# Patient Record
Sex: Female | Born: 1940 | Race: White | Hispanic: No | Marital: Married | Smoking: Current every day smoker
Health system: Southern US, Community
[De-identification: ages and names within clinical notes are randomized; demographics above are authoritative.]

## PROBLEM LIST (undated history)

## (undated) DIAGNOSIS — I10 Essential (primary) hypertension: Secondary | ICD-10-CM

---

## 2000-09-16 ENCOUNTER — Other Ambulatory Visit: Admission: RE | Admit: 2000-09-16 | Discharge: 2000-09-16 | Payer: Self-pay | Admitting: Obstetrics and Gynecology

## 2003-09-29 ENCOUNTER — Ambulatory Visit (HOSPITAL_COMMUNITY): Admission: RE | Admit: 2003-09-29 | Discharge: 2003-09-29 | Payer: Self-pay | Admitting: Obstetrics and Gynecology

## 2004-10-16 ENCOUNTER — Ambulatory Visit (HOSPITAL_COMMUNITY): Admission: RE | Admit: 2004-10-16 | Discharge: 2004-10-16 | Payer: Self-pay | Admitting: Obstetrics and Gynecology

## 2004-11-21 ENCOUNTER — Ambulatory Visit (HOSPITAL_COMMUNITY): Admission: RE | Admit: 2004-11-21 | Discharge: 2004-11-21 | Payer: Self-pay | Admitting: Family Medicine

## 2006-09-13 ENCOUNTER — Ambulatory Visit (HOSPITAL_COMMUNITY): Admission: RE | Admit: 2006-09-13 | Discharge: 2006-09-13 | Payer: Self-pay | Admitting: Obstetrics and Gynecology

## 2006-10-21 ENCOUNTER — Other Ambulatory Visit: Admission: RE | Admit: 2006-10-21 | Discharge: 2006-10-21 | Payer: Self-pay | Admitting: Obstetrics and Gynecology

## 2006-10-27 IMAGING — CR DG KNEE COMPLETE 4+V*R*
4 series · 4 of 4 positions shown · non-contrast
Comparison: None.

CLINICAL DATA: Pain and swelling of the right knee ? No known injury. 
 RIGHT KNEE COMPLETE ? 4 VIEWS:

[view not recorded (1 of 4)]
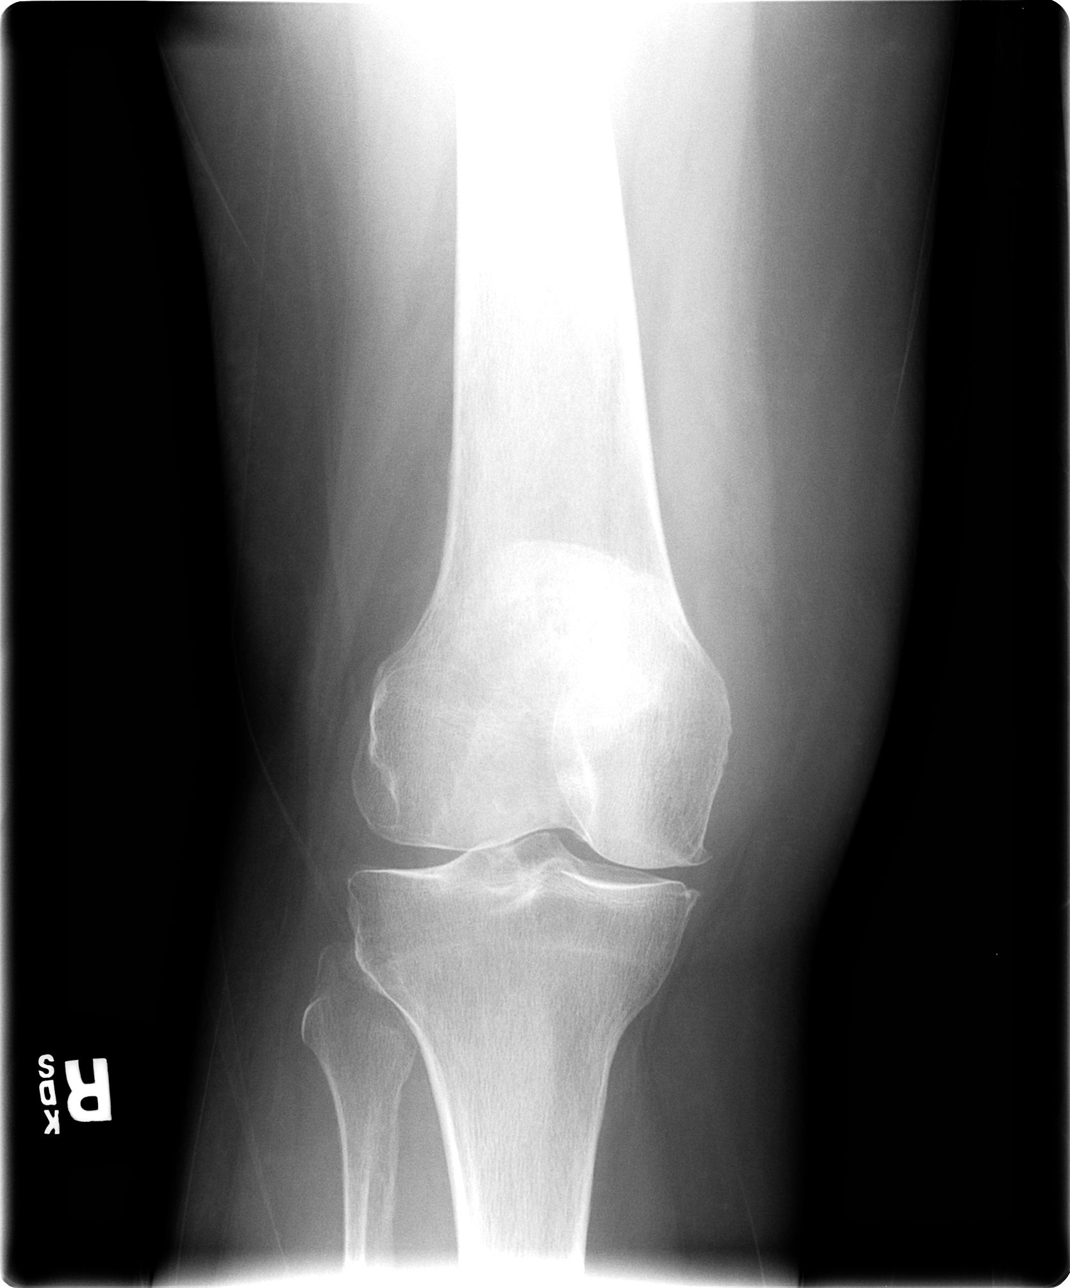

[view not recorded (2 of 4)]
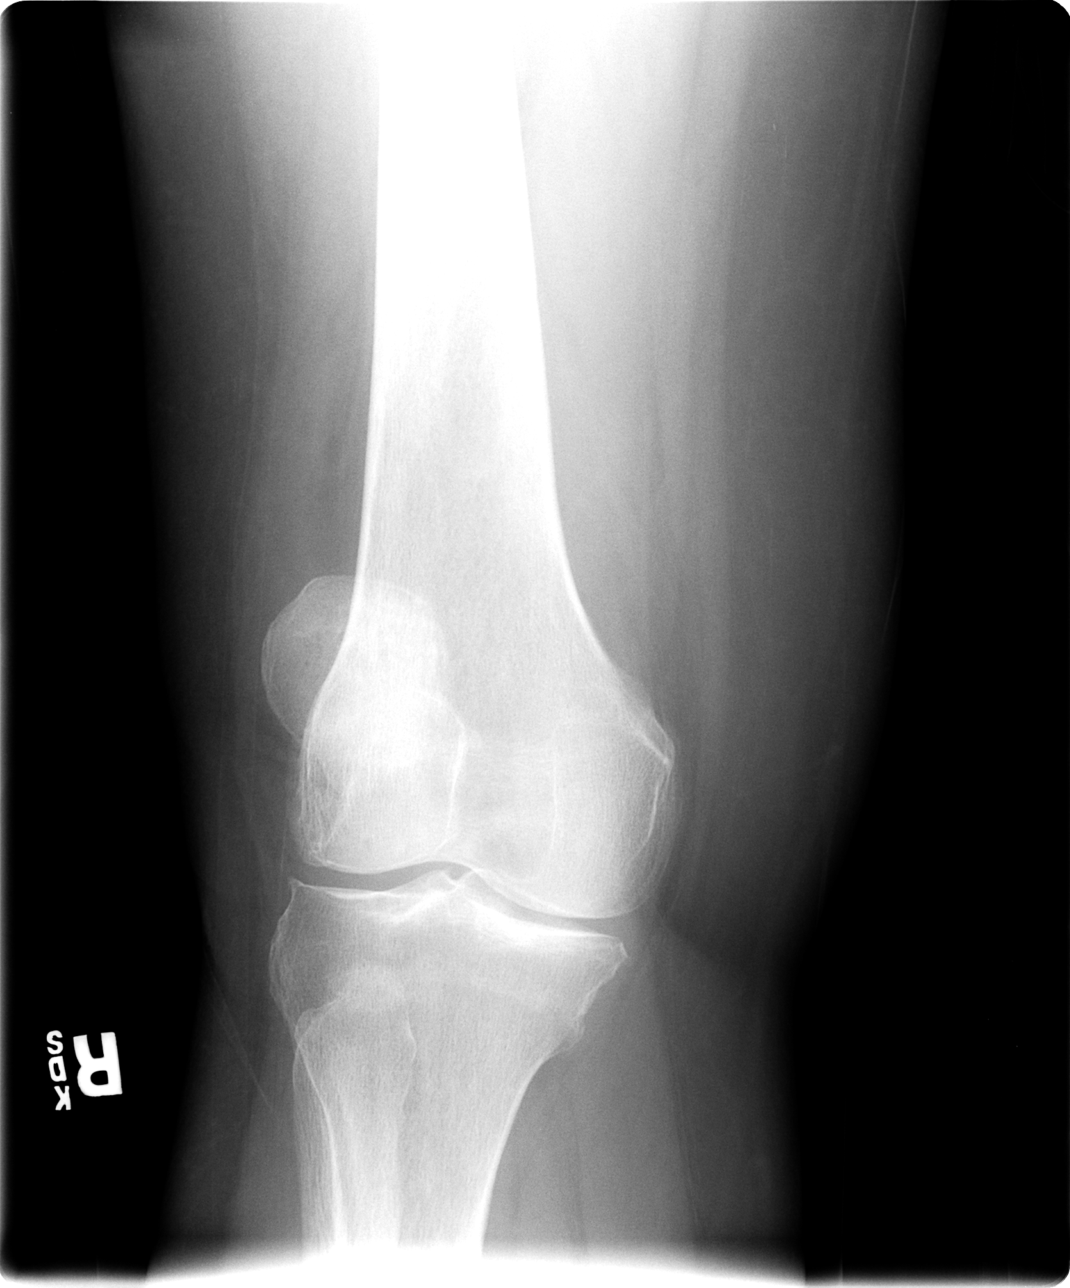

[view not recorded (3 of 4)]
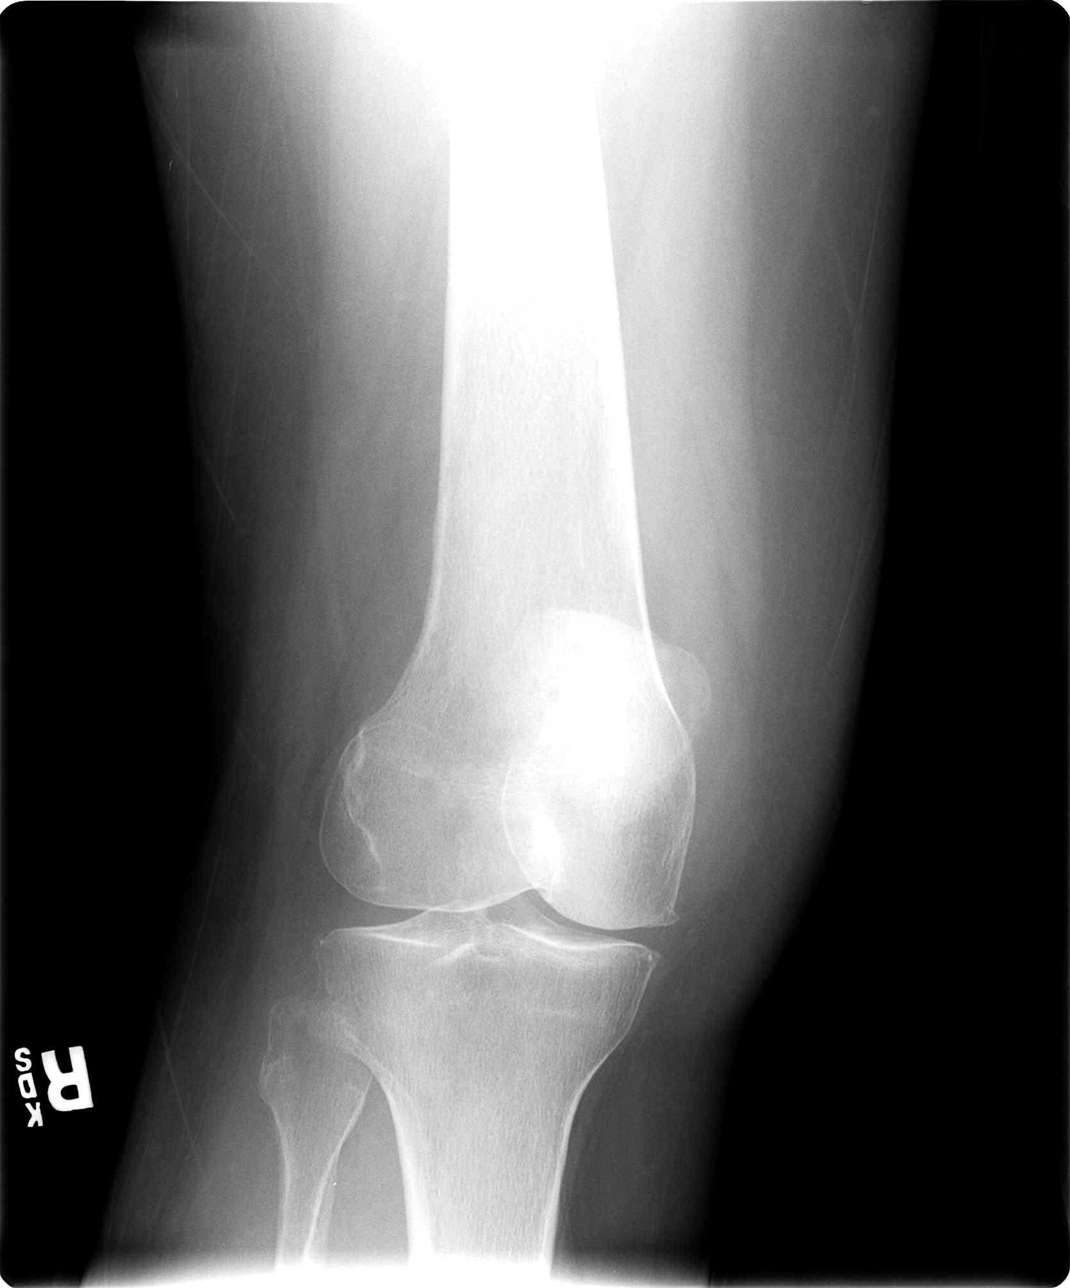

[view not recorded (4 of 4)]
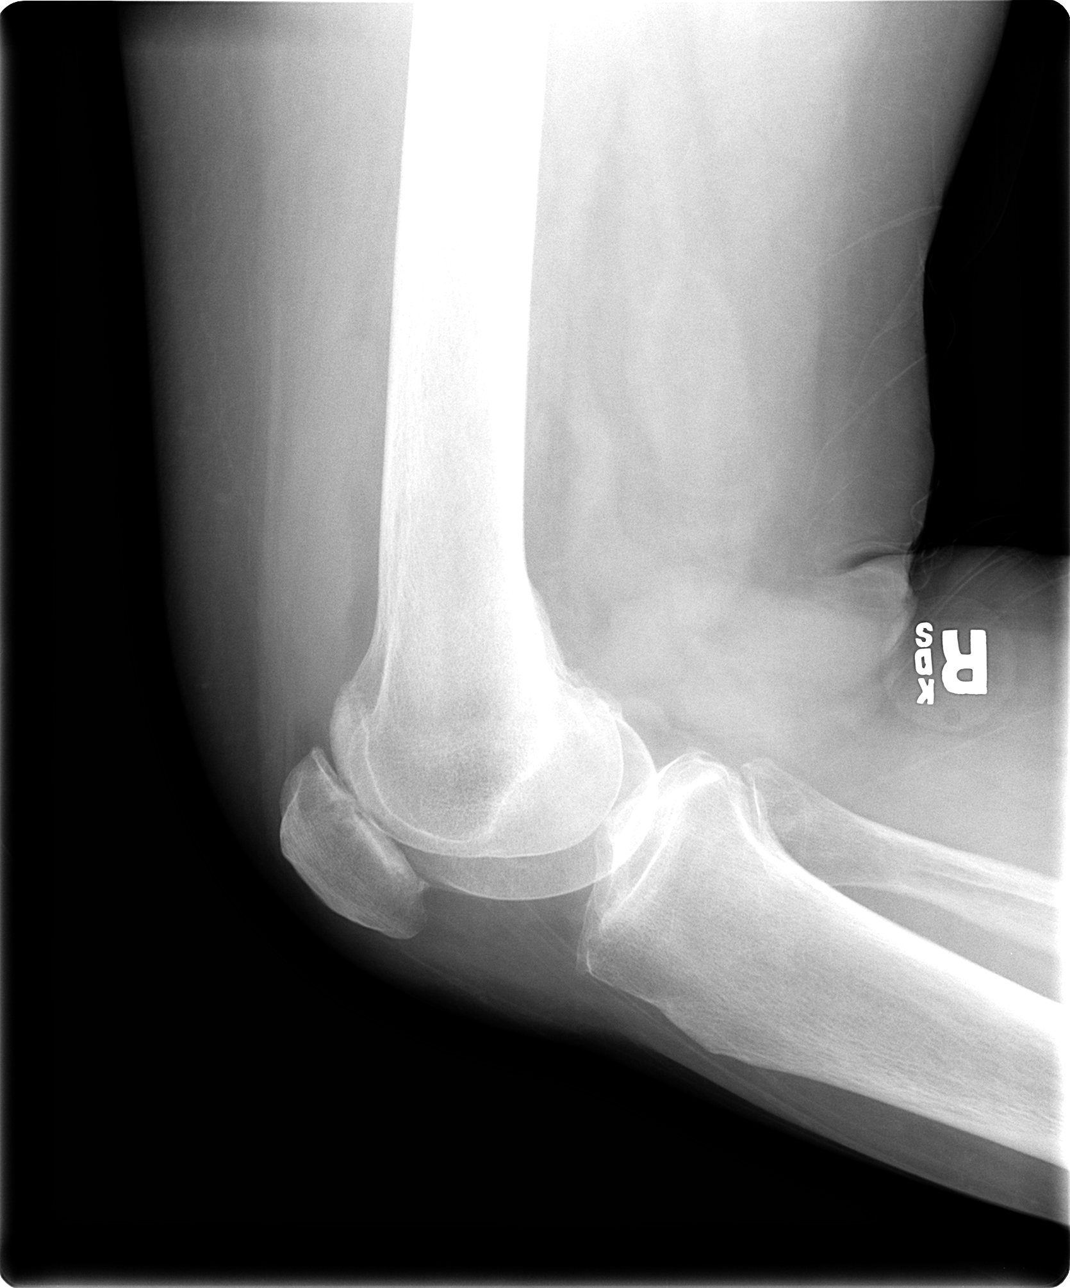

[4 of 4 positions shown; findings below may reference images not displayed]

FINDINGS: Mild compartmental degenerative changes with no acute abnormality.  No joint effusion.
IMPRESSION: Mild degenerative changes.

## 2006-10-30 ENCOUNTER — Ambulatory Visit: Payer: Self-pay | Admitting: Gastroenterology

## 2006-11-26 ENCOUNTER — Encounter: Payer: Self-pay | Admitting: Gastroenterology

## 2006-11-26 ENCOUNTER — Ambulatory Visit (HOSPITAL_COMMUNITY): Admission: RE | Admit: 2006-11-26 | Discharge: 2006-11-26 | Payer: Self-pay | Admitting: Gastroenterology

## 2006-11-26 ENCOUNTER — Ambulatory Visit: Payer: Self-pay | Admitting: Gastroenterology

## 2006-12-19 ENCOUNTER — Ambulatory Visit: Payer: Self-pay | Admitting: Internal Medicine

## 2007-10-23 DIAGNOSIS — E785 Hyperlipidemia, unspecified: Secondary | ICD-10-CM | POA: Insufficient documentation

## 2007-10-23 DIAGNOSIS — M1710 Unilateral primary osteoarthritis, unspecified knee: Secondary | ICD-10-CM

## 2007-10-23 DIAGNOSIS — D126 Benign neoplasm of colon, unspecified: Secondary | ICD-10-CM

## 2007-10-23 DIAGNOSIS — E7800 Pure hypercholesterolemia, unspecified: Secondary | ICD-10-CM

## 2007-10-23 DIAGNOSIS — K219 Gastro-esophageal reflux disease without esophagitis: Secondary | ICD-10-CM | POA: Insufficient documentation

## 2007-10-23 DIAGNOSIS — N390 Urinary tract infection, site not specified: Secondary | ICD-10-CM | POA: Insufficient documentation

## 2007-10-23 DIAGNOSIS — R3129 Other microscopic hematuria: Secondary | ICD-10-CM | POA: Insufficient documentation

## 2007-10-23 DIAGNOSIS — K449 Diaphragmatic hernia without obstruction or gangrene: Secondary | ICD-10-CM | POA: Insufficient documentation

## 2007-11-06 ENCOUNTER — Ambulatory Visit (HOSPITAL_COMMUNITY): Admission: RE | Admit: 2007-11-06 | Discharge: 2007-11-06 | Payer: Self-pay | Admitting: Obstetrics and Gynecology

## 2007-12-29 ENCOUNTER — Other Ambulatory Visit: Admission: RE | Admit: 2007-12-29 | Discharge: 2007-12-29 | Payer: Self-pay | Admitting: Obstetrics & Gynecology

## 2008-02-18 ENCOUNTER — Ambulatory Visit (HOSPITAL_COMMUNITY): Admission: RE | Admit: 2008-02-18 | Discharge: 2008-02-18 | Payer: Self-pay | Admitting: Family Medicine

## 2008-03-23 ENCOUNTER — Ambulatory Visit: Payer: Self-pay | Admitting: Vascular Surgery

## 2010-02-26 ENCOUNTER — Encounter: Payer: Self-pay | Admitting: Obstetrics and Gynecology

## 2010-06-20 NOTE — Assessment & Plan Note (Signed)
NAMEMarland Kitchen  Rich, Charlotte                  CHART#:  [id number]   DATE:  12/19/2006                       DOB:  07-01-1940   CHIEF COMPLAINT:  Follow up of procedure.   SUBJECTIVE:  Charlotte Rich is here for a follow up. She was last seen at the time  of EGD and colonoscopy by Dr. Kassie Mends. She was found to have an  esophageal stricture and underwent Savary dilation. She has complete  resolution of her dysphagia-type symptoms. She denies any heartburn  symptoms or epigastric pain which she was having before. Her bowel  movements are regular. There is no melena or rectal bleeding. She is  also found to have multiple polyps when in the transverse colon, two in  the rectum. The rectal polyp was tubular adenoma and the transverse  polyp was an adenoma as well. She had pancolonic diverticulosis and a 2  to 3 centimeter hiatal hernia.   CURRENT MEDICATIONS:  See the updated list.   ALLERGIES:  No known drug allergies.   PHYSICAL EXAMINATION:  VITAL SIGNS:  Weight 166 pounds, temperature  98.3, blood pressure 130/80, pulse 88.  GENERAL:  Pleasant, well developed, well nourished Caucasian female in  no acute distress.  SKIN:  Warm and dry. No jaundice.   IMPRESSION:  1. Esophageal stricture status post Savary dilations with resolution      of dysphagia-type symptoms.  2. Intermittent non-erosive reflux disease.  3. Colonic adenomas.   PLAN:  1. Prilosec OTC 20 mg daily as needed, number of 42, with 11 refills.  2. High fiber diet.  3. She will need a colonoscopy in five years and her first degree      relatives should be colon      cancer screened at age 63.  4. Office visit as needed.       Tana Coast, P.A.  Electronically Signed     R. Roetta Sessions, M.D.  Electronically Signed    LL/MEDQ  D:  12/19/2006  T:  12/20/2006  Job:  [id number]   cc:   Tilda Burrow, M.D.

## 2010-06-20 NOTE — Consult Note (Signed)
NEW PATIENT CONSULTATION   Charlotte Rich, Charlotte Rich  DOB:  [birth year]                                       03/23/2008  EAVWU#:[id number]    The patient is a 70 year old female referred for vascular surgery  consultation by Dr. Renard Matter for a pulsatile mass in the lower aspect of  the right neck.  This healthy female at her gyn exam this year and her  gynecologist noted a pulsatile mass in the lower neck on the right.  This was asymptomatic and was referred to Dr. Renard Matter who obtained an  ultrasound at [facility] which revealed that this was a dilated  and tortuous innominate and carotid artery with no other masses noted.  The patient denies any history of stroke or hemispheric or  nonhemispheric TIAs, amaurosis fugax, diplopia, blurred vision or  syncope.  She denies any pain in the neck or any trauma to the neck.   PAST MEDICAL HISTORY:  1. Hyperlipidemia.  2. Negative for diabetes, hypertension, coronary artery disease, COPD      or stroke.   PAST SURGICAL HISTORY:  1. Cholecystectomy.  2. Esophageal dilatation.   FAMILY HISTORY:  Positive for stroke in an uncle.  Negative for coronary  artery disease and diabetes.   SOCIAL HISTORY:  The patient is married, has two children and is a  retired Runner, broadcasting/film/video.  Smokes approximately half pack of cigarettes per day  for 45-50 years and does not use alcohol.   REVIEW OF SYSTEMS:  Does have symptoms of reflux esophagitis, hiatal  hernia and diffuse arthritis.  Negative for chest pain, dyspnea on  exertion, PND, orthopnea or pulmonary symptoms.   ALLERGIES:  None known.   MEDICATIONS:  1. Calcium with vitamin D.  2. Vitamin D.  3. Simvastatin 20 mg daily.   PHYSICAL EXAMINATION:  Vital signs:  Blood pressure 142/78, heart rate  72, respiration 14.  General:  She is a healthy-appearing female in no  apparent distress, alert and oriented x3.  Neck:  Supple, 3+ carotid  pulses are palpable.  There is a prominent  pulsation in the right  supraclavicular area.  It is lateral to the sternal notch.  This  measures about 2 to 2.5 cm in diameter.  It is not expansile in nature.  It is nontender.  Appears to be a redundant vessel.  She has excellent  carotid pulses bilaterally with no other masses palpable.  No bruits  audible.  Chest:  Clear to auscultation.  Cardiovascular:  Regular  rhythm.  No murmurs.  Abdomen:  Soft, nontender with no masses.  She has  3+ femoral, popliteal and dorsalis pedis pulses bilaterally.  Both feet  are well-perfused.   Carotid duplex exam reveals the common carotid artery to have no flow  reduction and no aneurysm is noted and the vessel is quite tortuous.  Of  note is the fact that a nonvascularized small anechoic mass was noted  bilaterally above the bifurcation of the internal carotid and external  carotid vessels which is totally separate from the area of concern.  (I  was unable to palpate any abnormalities on physical exam in this area  and this may well represent lymph nodes).   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  No  further evaluation or workup is needed.  Will see again on a p.r.n.  basis.   Quita Skye Hart [place], M.D.  Electronically Signed   JDL/MEDQ  D:  03/23/2008  T:  03/24/2008  Job:  [id number]   cc:   Angus G. Renard Matter, MD

## 2010-06-20 NOTE — Op Note (Signed)
NAMEZOOEY, SCHREURS NO.:  [id number]   MEDICAL RECORD NO.:  [id number]          PATIENT TYPE:  AMB   LOCATION:  DAY                           FACILITY:  [facility]   PHYSICIAN:  Kassie Mends, M.D.      DATE OF BIRTH:  09/02/1940   DATE OF PROCEDURE:  11/26/2006  DATE OF DISCHARGE:                               OPERATIVE REPORT   REFERRING Kelle Ruppert:  Cyril Mourning with Dr. Christin Bach.   PROCEDURE:  1. Colonoscopy with cold forceps and snare cautery polypectomy.  2. Esophagogastroduodenoscopy with cold-forceps biopsy and Savary      dilation to 15 mm.   INDICATION FOR EXAMINATION:  Ms. Haught is a 70 year old female who  presents with epigastric pain.  She describes it as a burning that has  been present for approximately four years.  She did take Prilosec twice  a day for 14 days and felt better, but the symptoms still persist.  She  has also had some difficulty swallowing, associated with pain in her  chest.  She chronically uses Aleve and Advil for her arthritis.   FINDINGS:  1. An 8-mm flat transverse colon polyp all on a fold which was removed      via cold forceps and snare cautery.  2. A 6-mm flat rectal polyp removed via cold forceps.  A 1-cm sessile      rectal polyp removed via snare cautery.  Otherwise, no masses,      inflammatory changes or arteriovenous malformations seen.  3. Pancolonic diverticulosis, most pronounced in the left colon.  4. Small internal hemorrhoids.  5. Distal esophageal stricture which allowed the scope to pass without      resistance.  The esophageal lumen was narrowed to approximately 12      mm.  The esophagus was dilated successively using the Savary      dilators from 12.8 mm to 15 mm.  The last dilator passed with      moderate resistance.  6. A 2- to 3-cm hiatal hernia.  Streaky erythema seen in the antrum      without erosion or ulceration.  Biopsies obtained via cold forceps      to evaluate for H pylori gastritis.   Otherwise normal duodenum.   RECOMMENDATIONS:  1. Clear liquid diet for 24 hours, then low residue diet for 3 hours.      Then, she may begin a high-fiber diet.  She was given a handout on      clear liquid diet, low-residue diet and high-fiber diet.  2. No aspirin, NSAIDs or anticoagulation for 7 days.  3. Will call Ms. Widmer for the results of her biopsies.  If her      biopsies are positive for H pylori, she will receive treatment.  If      her biopsies from her polyps from her colon are adenomatous, then      she should have a screening colonoscopy in 5 years.  First degree      relatives should then begin colon cancer screening at age 22 and  then every 10 years because her polyps were diagnosed after the age      of 47.  4. She is also given information on diverticulosis, polyps and      hemorrhoids.  5. OPV with Tana Coast in one month to reassess swallowing.   MEDICATION:  1. Demerol 50 mg IV.  2. Versed 6 mg IV.   PROCEDURE TECHNIQUE:  Physical exam was performed.  Informed consent was  obtained from the patient explaining benefits, risks and alternatives to  the procedure.  The patient was connected to the monitor and placed in  left lateral position.  Continuous oxygen was provided by nasal cannula  and IV medicine administered through an indwelling cannula.  After  administration of sedation and rectal exam, the patient's rectum was  intubated, and the scope was advanced under direct visualization to the  cecum.  The scope was removed slowly by carefully examining the color,  texture, anatomy and integrity of the mucosa on the way out.   After the colonoscopy, the patient's esophagus was intubated with a  diagnostic gastroscope, and the scope was advanced under direct  visualization to the second portion of the duodenum.  The scope was  removed slowly by carefully examining the color, texture, anatomy and  integrity of the mucosa on the way out.  Prior to  withdrawing the scope,  retroflexed view of the cardia and the fundus portion was performed  which demonstrated the hiatal hernia and the distal esophageal  stricture.  The Savary guide wire was introduced through the diagnostic  gastroscope.  The scope was withdrawn, and the Savary dilators were  introduced successively over the guide wire.  The patient was recovered  in endoscopy and discharged home in satisfactory condition.      Kassie Mends, M.D.  Electronically Signed     SM/MEDQ  D:  11/26/2006  T:  11/26/2006  Job:  [id number]   cc:   Toma Deiters, M.D.  Fax: [phone]

## 2010-06-20 NOTE — Consult Note (Signed)
NAMECHRISHELLE, ZITO NO.:  [id number]   MEDICAL RECORD NO.:  [id number]          PATIENT TYPE:  AMB   LOCATION:  END                           FACILITY:  [facility]   PHYSICIAN:  Kassie Mends, M.D.      DATE OF BIRTH:  01-17-1941   DATE OF CONSULTATION:  10/30/2006  DATE OF DISCHARGE:                                 CONSULTATION   REQUESTING PHYSICIAN:  Cyril Mourning, NP with Dr. Christin Bach.   REASON FOR CONSULTATION:  Pain with swallowing, colonoscopy.   HISTORY OF PRESENT ILLNESS:  The patient is a 70 year old female with  presents for further evaluation of pain with swallowing.  She has had  this going on for several weeks now.  She states whenever she eats  certain foods, whether they are grilled or fried, she has pain in her  chest when she swallows.  It feels as though that she has slot as it  moves down into her stomach.  She also notes this with eating cinnamon.  She does have occasional reflux.  She recently started Prilosec daily  only for a few days.  She notes at times she feels like the food does  not go down well, but she often will get up a lot of phlegm when this  occurs.  She thinks it is from her sinuses draining.  She had a recent  episode which caused her to vomit.  She complains of epigastric burning  with certain foods including fried or grills foods.  Her bowel movements  occur regularly.  She generally has postprandial bowel movements without  any urgency, however.  She denies any melena or rectal bleeding.  She  states she took Aleve and Advil previously for her arthritis, but really  none in the last couple of years; she had to stop it on occasion because  of epigastric pain.  She never had an EGD with colonoscopy.   CURRENT MEDICATIONS:  1. Vitamin D two each week.  2. Calcium with vitamin D daily.  3. Prilosec daily.  4. Cipro b.i.d. for UTI.  5. Zocor 20 mg daily.  6. Advil p.r.n., but rarely takes.   ALLERGIES:  No known drug  allergies.   PAST MEDICAL HISTORY:  1. GERD.  2. Hypercholesterolemia.  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  4. Currently being treated for a UTI.  5. Cholecystectomy 7 years ago for stones.   FAMILY HISTORY:  Mother is 27; she has hypertension and  hypercholesterolemia.  Father is deceased at age 73.  No known chronic  medical problems.  No family history of colorectal cancer.   SOCIAL HISTORY:  She is married with 2 children.  She is retired from  the school system from the past 3 years. She has smoked a half a pack of  cigarettes daily.  She admits to having 1 glass of white wine  approximately 3 times a week.   REVIEW OF SYSTEMS:  GI:  See HPI.  CONSTITUTIONAL:  Denies any weight  loss.  CARDIOPULMONARY:  Denies shortness of breath.  She has pain with  swallowing and she had pain in her chest when she swallows.  GU:  Denied  any dysuria or gross hematuria,but recently had microscopic hematuria on  UA and is being treated for a UTI.   PHYSICAL EXAMINATION:  VITAL SIGNS:  Weight 171, height 5 feet 3-1/2  inches.  Temperature 97.8, blood pressure 144/96, pulse 80.  GENERAL:  A well-nourished, well-developed Caucasian female in no acute  distress.  SKIN:  Warm and dry.  No jaundice.  HEENT:  Sclerae anicteric.  Oropharyngeal mucosa moist and pink. No  lesions, erythema or exudate.  NECK:  No lymphadenopathy or thyromegaly.  CHEST:  Relatively clear to auscultation.  CARDIAC:  Exam reveals a regular rate and rhythm, normal S1 and S2, no  murmurs, rubs, or gallops.  ABDOMEN:  Positive bowel sounds.  Abdomen is soft, nontender and non-  distended.  No organomegaly or masses.  No rebound tenderness.  No  guarding.  No palpable masses.  EXTREMITIES:  No edema.   IMPRESSION/PLAN:  The patient is a pleasant 70 year old lady with  several-week history of epigastric burning and burning in her chest when  she swallows certain foods.  She had some vague dysphagia-type symptoms.  Indeed,  there is a  __________  possibility of esophagitis or gastritis.  Less likely is peptic ulcer disease.  She is taking limited nonsteroidal  anti-inflammatory drugs.  She has never had a colonoscopy and  recommended screening colonoscopy at this time,  esophagogastroduodenoscopy and colonoscopy by Dr. Kassie Mends in the  near future.  I discussed risks and alternatives of significance with  regards to, but not limited to, the __________  medication, bleeding,  infection and perforation.  The patient is agreeable to proceed.   I would like to thank Cyril Mourning, nurse practitioner with Dr. Christin Bach, for allowing [place] to take part in the care of this patient.      Tana Coast, P.A.      Kassie Mends, M.D.  Electronically Signed    LL/MEDQ  D:  10/30/2006  T:  10/31/2006  Job:  [id number]   cc:   Tilda Burrow, M.D.  Fax: [phone]

## 2010-06-20 NOTE — Procedures (Signed)
CAROTID DUPLEX EXAM   INDICATION:  Carotid artery disease.   HISTORY:  Diabetes:  No.  Cardiac:  No.  Hypertension:  No.  Smoking:  Yes.  Previous Surgery:  No.  CV History:  No.  Amaurosis Fugax No, Paresthesias No, Hemiparesis No.                                       RIGHT             LEFT  Brachial systolic pressure:         154               146  Brachial Doppler waveforms:         Within normal limits                Within normal limits  Vertebral direction of flow:        Antegrade         Antegrade  DUPLEX VELOCITIES (cm/sec)  CCA peak systolic                   105               94  ECA peak systolic                   149               103  ICA peak systolic                   89                109  ICA end diastolic                   31                46  PLAQUE MORPHOLOGY:                  None              None  PLAQUE AMOUNT:                      None              None  PLAQUE LOCATION:                    None              None   IMPRESSION:  1. Patent bilateral internal carotid arteries with no evidence of      stenosis.  2. 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   ___________________________________________  Quita Skye. Hart [place], M.D.   AC/MEDQ  D:  03/23/2008  T:  03/23/2008  Job:  [id number]

## 2011-10-25 ENCOUNTER — Encounter: Payer: Self-pay | Admitting: *Deleted

## 2012-12-15 ENCOUNTER — Other Ambulatory Visit (HOSPITAL_COMMUNITY): Payer: Self-pay | Admitting: Family Medicine

## 2012-12-15 DIAGNOSIS — Z139 Encounter for screening, unspecified: Secondary | ICD-10-CM

## 2012-12-18 ENCOUNTER — Ambulatory Visit (HOSPITAL_COMMUNITY)
Admission: RE | Admit: 2012-12-18 | Discharge: 2012-12-18 | Disposition: A | Discharge status: Home / Self Care | Payer: Medicare Other | Source: Ambulatory Visit | Attending: Family Medicine | Admitting: Family Medicine

## 2012-12-18 ENCOUNTER — Other Ambulatory Visit (HOSPITAL_COMMUNITY): Payer: Self-pay | Admitting: Family Medicine

## 2012-12-18 DIAGNOSIS — F17200 Nicotine dependence, unspecified, uncomplicated: Secondary | ICD-10-CM | POA: Insufficient documentation

## 2012-12-18 DIAGNOSIS — Z1231 Encounter for screening mammogram for malignant neoplasm of breast: Secondary | ICD-10-CM | POA: Insufficient documentation

## 2012-12-18 DIAGNOSIS — Z139 Encounter for screening, unspecified: Secondary | ICD-10-CM

## 2012-12-23 ENCOUNTER — Other Ambulatory Visit: Payer: Self-pay | Admitting: Family Medicine

## 2012-12-23 DIAGNOSIS — R928 Other abnormal and inconclusive findings on diagnostic imaging of breast: Secondary | ICD-10-CM

## 2013-02-04 ENCOUNTER — Other Ambulatory Visit: Payer: Self-pay | Admitting: Family Medicine

## 2013-02-04 ENCOUNTER — Ambulatory Visit
Admission: RE | Admit: 2013-02-04 | Discharge: 2013-02-04 | Disposition: A | Discharge status: Home / Self Care | Payer: Medicare Other | Source: Ambulatory Visit | Attending: Family Medicine | Admitting: Family Medicine

## 2013-02-04 DIAGNOSIS — R928 Other abnormal and inconclusive findings on diagnostic imaging of breast: Secondary | ICD-10-CM

## 2014-07-06 ENCOUNTER — Other Ambulatory Visit (HOSPITAL_COMMUNITY): Payer: Self-pay | Admitting: Family Medicine

## 2014-07-06 DIAGNOSIS — Z1231 Encounter for screening mammogram for malignant neoplasm of breast: Secondary | ICD-10-CM

## 2014-07-15 ENCOUNTER — Ambulatory Visit (HOSPITAL_COMMUNITY)
Admission: RE | Admit: 2014-07-15 | Discharge: 2014-07-15 | Disposition: A | Discharge status: Home / Self Care | Payer: Medicare Other | Source: Ambulatory Visit | Attending: Family Medicine | Admitting: Family Medicine

## 2014-07-15 DIAGNOSIS — Z1231 Encounter for screening mammogram for malignant neoplasm of breast: Secondary | ICD-10-CM | POA: Diagnosis present

## 2014-11-23 IMAGING — CR DG CHEST 2V
2 series · 2 of 2 positions shown · non-contrast
Comparison: None.

CLINICAL DATA: History of tobacco use for multiple years, initial
encounter

EXAM:
CHEST  2 VIEW

[view not recorded (1 of 2)]
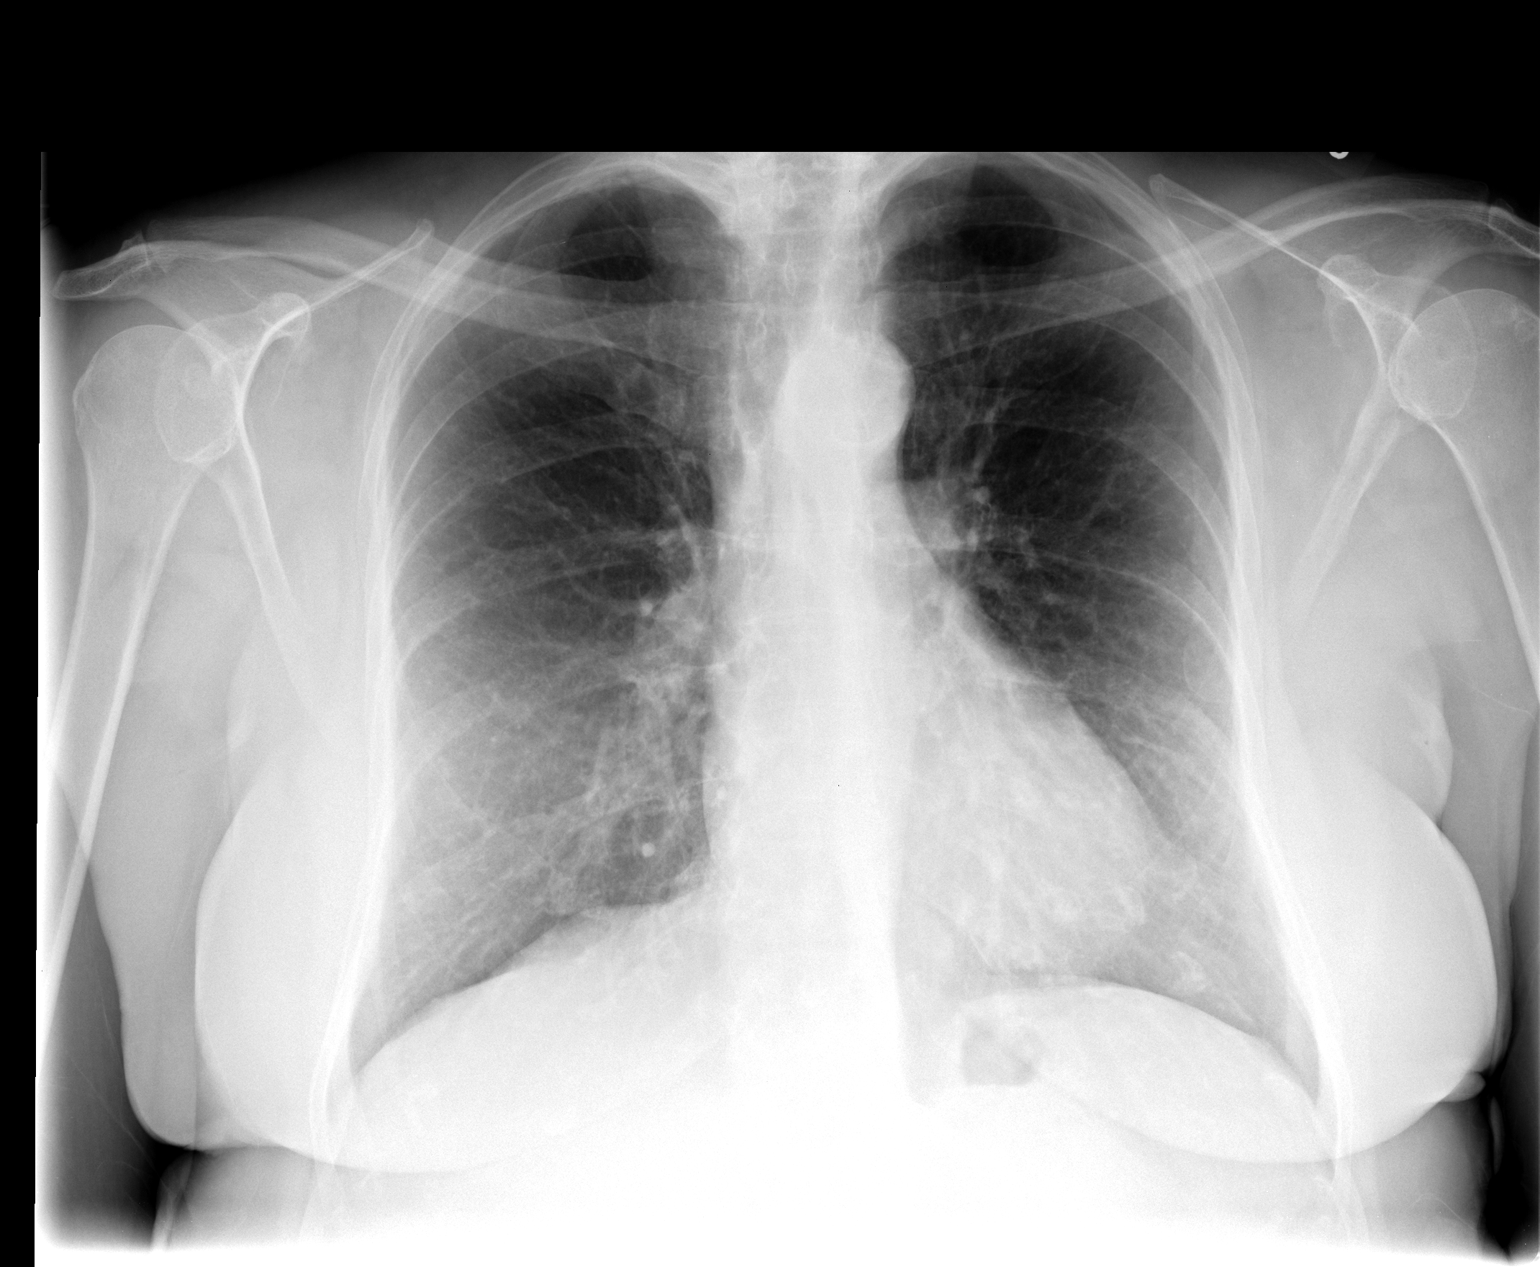

[view not recorded (2 of 2)]
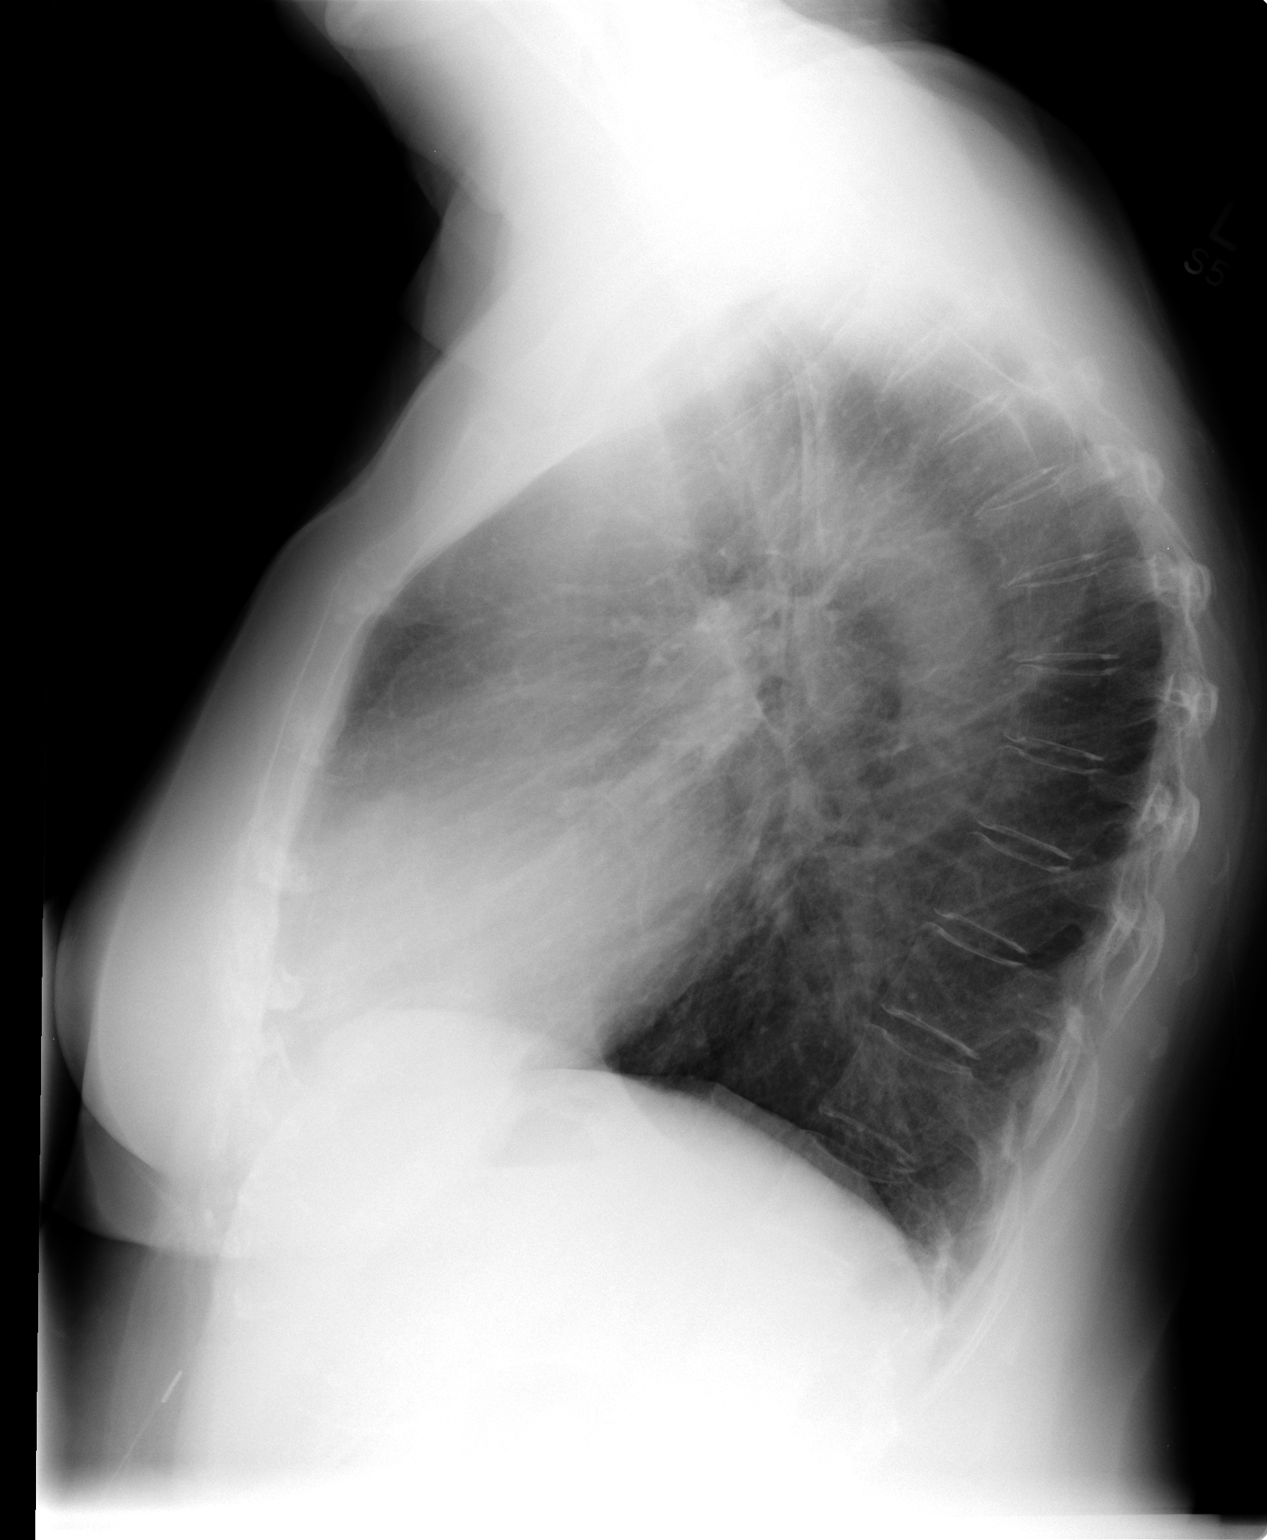

[2 of 2 positions shown; findings below may reference images not displayed]

FINDINGS: The cardiac shadow is within normal limits. The lungs are well
aerated bilaterally without focal infiltrate or sizable effusion.
The bony structures are unremarkable.
IMPRESSION: No acute abnormality is noted.

## 2022-08-14 ENCOUNTER — Other Ambulatory Visit: Payer: Self-pay

## 2022-08-14 ENCOUNTER — Ambulatory Visit: Payer: Medicare PPO | Admitting: Family Medicine

## 2022-08-14 ENCOUNTER — Encounter: Payer: Self-pay | Admitting: Family Medicine

## 2022-08-14 ENCOUNTER — Encounter (HOSPITAL_COMMUNITY): Payer: Self-pay | Admitting: Emergency Medicine

## 2022-08-14 ENCOUNTER — Emergency Department (HOSPITAL_COMMUNITY)
Admission: EM | Admit: 2022-08-14 | Discharge: 2022-08-14 | Disposition: A | Discharge status: Home / Self Care | Payer: Medicare PPO | Attending: Emergency Medicine | Admitting: Emergency Medicine

## 2022-08-14 ENCOUNTER — Emergency Department (HOSPITAL_COMMUNITY): Payer: Medicare PPO

## 2022-08-14 VITALS — BP 200/100 | HR 76 | Ht 62.0 in | Wt 147.1 lb

## 2022-08-14 DIAGNOSIS — R7301 Impaired fasting glucose: Secondary | ICD-10-CM | POA: Diagnosis not present

## 2022-08-14 DIAGNOSIS — E7849 Other hyperlipidemia: Secondary | ICD-10-CM

## 2022-08-14 DIAGNOSIS — Z79899 Other long term (current) drug therapy: Secondary | ICD-10-CM | POA: Insufficient documentation

## 2022-08-14 DIAGNOSIS — E038 Other specified hypothyroidism: Secondary | ICD-10-CM

## 2022-08-14 DIAGNOSIS — E559 Vitamin D deficiency, unspecified: Secondary | ICD-10-CM

## 2022-08-14 DIAGNOSIS — R519 Headache, unspecified: Secondary | ICD-10-CM | POA: Diagnosis present

## 2022-08-14 DIAGNOSIS — Z1159 Encounter for screening for other viral diseases: Secondary | ICD-10-CM

## 2022-08-14 DIAGNOSIS — Z13820 Encounter for screening for osteoporosis: Secondary | ICD-10-CM

## 2022-08-14 DIAGNOSIS — I10 Essential (primary) hypertension: Secondary | ICD-10-CM | POA: Insufficient documentation

## 2022-08-14 DIAGNOSIS — I169 Hypertensive crisis, unspecified: Secondary | ICD-10-CM | POA: Insufficient documentation

## 2022-08-14 DIAGNOSIS — Z114 Encounter for screening for human immunodeficiency virus [HIV]: Secondary | ICD-10-CM

## 2022-08-14 HISTORY — DX: Essential (primary) hypertension: I10

## 2022-08-14 LAB — BASIC METABOLIC PANEL
Anion gap: 10 (ref 5–15)
BUN: 12 mg/dL (ref 8–23)
CO2: 23 mmol/L (ref 22–32)
Calcium: 9.3 mg/dL (ref 8.9–10.3)
Chloride: 103 mmol/L (ref 98–111)
Creatinine, Ser: 0.7 mg/dL (ref 0.44–1.00)
GFR, Estimated: >60 mL/min (ref >60)
Glucose, Bld: 101 mg/dL — ABNORMAL HIGH (ref 70–99)
Potassium: 3.7 mmol/L (ref 3.5–5.1)
Sodium: 136 mmol/L (ref 135–145)

## 2022-08-14 LAB — CBC
HCT: 43.8 % (ref 36.0–46.0)
Hemoglobin: 14.2 g/dL (ref 12.0–15.0)
MCH: 30 pg (ref 26.0–34.0)
MCHC: 32.4 g/dL (ref 30.0–36.0)
MCV: 92.4 fL (ref 80.0–100.0)
Platelets: 397 10*3/uL (ref 150–400)
RBC: 4.74 MIL/uL (ref 3.87–5.11)
RDW: 14.2 % (ref 11.5–15.5)
WBC: 9.4 10*3/uL (ref 4.0–10.5)
nRBC: 0 % (ref 0.0–0.2)

## 2022-08-14 MED ORDER — LISINOPRIL 10 MG PO TABS: 10.0000 mg | ORAL_TABLET | Freq: Every day | ORAL | 1 refills | Status: DC

## 2022-08-14 MED ORDER — LISINOPRIL 10 MG PO TABS
10.0000 mg | ORAL_TABLET | ORAL | Status: AC
  Administered 2022-08-14: 10 mg via ORAL
  Filled 2022-08-14: qty 1

## 2022-08-14 NOTE — Assessment & Plan Note (Addendum)
Hx of Hypertension Unsure of the names of antihypertensives that she was on BP in the clinic is 200/100 C/o a slight headache No chest pain, palpitations, SOB or visual disturbances EKG in the clinic shows sinus rthyme with left atrial enlargement Reports BP of 180/100 a week ago Encouraged to go to ED for hypertensive crisis

## 2022-08-14 NOTE — Progress Notes (Signed)
New Patient Office Visit  Subjective:  Patient ID: Charlotte Rich, female    DOB: [birth year]  Age: 82 y.o. MRN: [id number]  CC:  Chief Complaint  Patient presents with   Establish Care    New patient establishing care has not had a pcp in a few years.     HPI Charlotte Rich is a 82 y.o. female with past medical history of HTN presents for establishing care. For the details of today's visit, please refer to the assessment and plan.     Past Medical History:  Diagnosis Date   Hypertension     History reviewed. No pertinent surgical history.  History reviewed. No pertinent family history.  Social History   Socioeconomic History   Marital status: Married    Spouse name: Not on file   Number of children: Not on file   Years of education: Not on file   Highest education level: Not on file  Occupational History   Not on file  Tobacco Use   Smoking status: Every Day    Types: Cigarettes   Smokeless tobacco: Never   Tobacco comments:    8 cig a day  Substance and Sexual Activity   Alcohol use: Yes    Comment: occ   Drug use: Not Currently   Sexual activity: Not Currently  Other Topics Concern   Not on file  Social History Narrative   Not on file   Social Determinants of Health   Financial Resource Strain: Not on file  Food Insecurity: Not on file  Transportation Needs: Not on file  Physical Activity: Not on file  Stress: Not on file  Social Connections: Not on file  Intimate Partner Violence: Not on file    ROS Review of Systems  Constitutional:  Negative for chills and fever.  Eyes:  Negative for visual disturbance.  Respiratory:  Negative for chest tightness and shortness of breath.   Neurological:  Positive for headaches. Negative for dizziness.    Objective:   Today's Vitals: BP (!) 200/100 (BP Location: Left Arm)   Pulse 76   Ht 5\' 2"  (1.575 m)   Wt 147 lb 1.3 oz (66.7 kg)   SpO2 92%   BMI 26.90 kg/m   Physical Exam HENT:     Head:  Normocephalic.     Mouth/Throat:     Mouth: Mucous membranes are moist.  Cardiovascular:     Rate and Rhythm: Normal rate.     Heart sounds: Normal heart sounds.  Pulmonary:     Effort: Pulmonary effort is normal.     Breath sounds: Normal breath sounds.  Neurological:     Mental Status: She is alert.      Assessment & Plan:   Hypertensive crisis Assessment & Plan: Hx of Hypertension Unsure of the names of antihypertensives that she was on BP in the clinic is 200/100 C/o a slight headache No chest pain, palpitations, SOB or visual disturbances EKG in the clinic shows sinus rthyme with left atrial enlargement Reports BP of 180/100 a week ago Encouraged to go to ED for hypertensive crisis    Hypertension, unspecified type -     EKG 12-Lead  IFG (impaired fasting glucose) -     Hemoglobin A1c  Vitamin D deficiency -     VITAMIN D 25 Hydroxy (Vit-D Deficiency, Fractures)  Need for hepatitis C screening test -     Hepatitis C antibody  Encounter for screening for HIV -  HIV Antibody (routine testing w rflx)  Other specified hypothyroidism -     TSH + free T4  Other hyperlipidemia -     Lipid panel -     CMP14+EGFR -     CBC with Differential/Platelet  Osteoporosis screening -     DG Bone Density  Note: This chart has been completed using Engineer, civil (consulting) software, and while attempts have been made to ensure accuracy, certain words and phrases may not be transcribed as intended.     Follow-up: Return in about 1 week (around 08/21/2022).   Gilmore Laroche, FNP

## 2022-08-14 NOTE — ED Triage Notes (Signed)
Pt via POV sent from PCP due to elevated BP 200/105. Pt reports a mild anterior headache rated 1/10 but denies dizziness and blurry vision. A/O x 4 and ambulatory w/o assistance at time of triage. PMH includes HTN but pt has not been compliant with medications.

## 2022-08-14 NOTE — Discharge Instructions (Signed)
Please have your doctor refer you to an ear nose and throat doctor for an evaluation of your hoarseness.  In people that are chronic tobacco users there is commonly abnormalities that occur in the throat that can cause hoarseness, some of these can be cancerous.  Take lisinopril once a day, be aware that this may cause swelling of your lips your tongue or your throat and can be quite severe, this is a very unusual side effect but if it occurs stop the medicine come to the ER immediately.  This is a very easy medication to take and is generally very safe, take it once a day, take it at the same time every day.  Please have your family doctor recheck your blood pressure in 1 week.  ER for worsening symptoms

## 2022-08-14 NOTE — Patient Instructions (Addendum)
I appreciate the opportunity to provide care to you today!    Follow up:  1 week  Labs: please stop by the lab during the week to get your blood drawn (CBC, CMP, TSH, Lipid profile, HgA1c, Vit D)  Screening: HIV and Hep C   Please schedule medicare annual wellness  Please stop by your local pharmacy and get your Tdap and Shingles vaccine    Your Blood pressure reading in the clinic is 205/100 and 200/100. This reading is consider hypertensive crisis and require urgent care. Please follow up in the ED  Attached with your AVS, you will find valuable resources for self-education. I highly recommend dedicating some time to thoroughly examine them.   Please continue to a heart-healthy diet and increase your physical activities. Try to exercise for at least five days a week.    It was a pleasure to see you and I look forward to continuing to work together on your health and well-being. Please do not hesitate to call the office if you need care or have questions about your care.  In case of emergency, please visit the Emergency Department for urgent care, or contact our clinic at [phone] to schedule an appointment. We're here to help you!   Have a wonderful day and week. With Gratitude, Gilmore Laroche MSN, FNP-BC

## 2022-08-14 NOTE — ED Provider Notes (Signed)
[facility] AT [facility] Provider Note   CSN: [id number] Arrival date & time: 08/14/22  1659     History  Chief Complaint  Patient presents with   Hypertension    Charlotte Rich is a 82 y.o. female.   Hypertension   82 y/o female - has RN that came to the house today once a year - saw high BP - sent to UC and sent here here.  Has hx of Htn 15 years ago - has not been told that she had any recurrent htn since.  She has no other sx at this time and only came b/c a nurse told her that she had htn today.  Minimal headache - no CP / SOB.      Home Medications Prior to Admission medications   Medication Sig Start Date End Date Taking? Authorizing Provider  lisinopril (ZESTRIL) 10 MG tablet Take 1 tablet (10 mg total) by mouth daily. 08/14/22  Yes Eber Hong, MD      Allergies    Patient has no known allergies.    Review of Systems   Review of Systems  All other systems reviewed and are negative.   Physical Exam Updated Vital Signs BP (!) 226/92 (BP Location: Right Arm)   Pulse 70   Temp 98.5 F (36.9 C) (Oral)   Resp 16   Ht 1.575 m (5\' 2" )   Wt 66.7 kg   SpO2 96%   BMI 26.89 kg/m  Physical Exam Vitals and nursing note reviewed.  Constitutional:      General: She is not in acute distress.    Appearance: She is well-developed.  HENT:     Head: Normocephalic and atraumatic.     Mouth/Throat:     Pharynx: No oropharyngeal exudate.  Eyes:     General: No scleral icterus.       Right eye: No discharge.        Left eye: No discharge.     Conjunctiva/sclera: Conjunctivae normal.     Pupils: Pupils are equal, round, and reactive to light.  Neck:     Thyroid: No thyromegaly.     Vascular: No JVD.  Cardiovascular:     Rate and Rhythm: Normal rate and regular rhythm.     Heart sounds: Normal heart sounds. No murmur heard.    No friction rub. No gallop.  Pulmonary:     Effort: Pulmonary effort is normal. No respiratory distress.      Breath sounds: Normal breath sounds. No wheezing or rales.  Abdominal:     General: Bowel sounds are normal. There is no distension.     Palpations: Abdomen is soft. There is no mass.     Tenderness: There is no abdominal tenderness.  Musculoskeletal:        General: No tenderness. Normal range of motion.     Cervical back: Normal range of motion and neck supple.  Lymphadenopathy:     Cervical: No cervical adenopathy.  Skin:    General: Skin is warm and dry.     Findings: No erythema or rash.  Neurological:     Mental Status: She is alert.     Coordination: Coordination normal.     Comments: Neurologic exam:  Speech clear, pupils equal round reactive to light, extraocular movements intact  Normal peripheral visual fields Cranial nerves III through XII normal including no facial droop Follows commands, moves all extremities x4, normal strength to bilateral upper and lower extremities at  all major muscle groups including grip Sensation normal to light touch and pinprick Coordination intact, no limb ataxia, finger-nose-finger normal, heel shin normal bilaterally Rapid alternating movements normal No pronator drift Gait normal Can heal and toe walk without weakness.    Psychiatric:        Behavior: Behavior normal.     ED Results / Procedures / Treatments   Labs (all labs ordered are listed, but only abnormal results are displayed) Labs Reviewed  BASIC METABOLIC PANEL - Abnormal; Notable for the following components:      Result Value   Glucose, Bld 101 (*)    All other components within normal limits  CBC    EKG EKG Interpretation Date/Time:  Tuesday August 14 2022 17:21:45 EDT Ventricular Rate:  73 PR Interval:  171 QRS Duration:  132 QT Interval:  421 QTC Calculation: 464 R Axis:   -7  Text Interpretation: Sinus rhythm Left bundle branch block No old tracing to compare Confirmed by Eber Hong ([id number]) on 08/14/2022 5:28:19 PM  Radiology DG Chest 2  View  Result Date: 08/14/2022 CLINICAL DATA:  Hypertension EXAM: CHEST - 2 VIEW COMPARISON:  12/18/2012 FINDINGS: Hyperinflation compatible with COPD. Heart and mediastinal contours are within normal limits. No focal opacities or effusions. No acute bony abnormality. Aortic atherosclerosis. IMPRESSION: COPD.  No active cardiopulmonary disease. Electronically Signed   By: Charlett Nose M.D.   On: 08/14/2022 18:12    Procedures Procedures    Medications Ordered in ED Medications  lisinopril (ZESTRIL) tablet 10 mg (has no administration in time range)    ED Course/ Medical Decision Making/ A&P                             Medical Decision Making Amount and/or Complexity of Data Reviewed Labs: ordered. Radiology: ordered.  Risk Prescription drug management.    This patient presents to the ED for concern of asymptomatic hypertension. differential diagnosis includes likely has some element of chronic hypertension, she does not go to the doctor    Additional history obtained:  Additional history obtained from medical record External records from outside source obtained and reviewed including no old EKGs for comparison No recent visits to the ER   Lab Tests:  I Ordered, and personally interpreted labs.  The pertinent results include:  BMP and CBC without findings.   Imaging Studies ordered:  I ordered imaging studies including CXR -   I independently visualized and interpreted imaging which showed no acute findings. I agree with the radiologist interpretation   Medicines ordered and prescription drug management:  I ordered medication including lisinopril  for hypertension  Reevaluation of the patient after these medicines showed that the patient improved I have reviewed the patients home medicines and have made adjustments as needed   Problem List / ED Course:  Hypertension Labs reassuring BP down to 190's ENT f/u for hoarseness   Social Determinants of  Health:  Needs local PCP - established today at [facility].   Has f/u for same.           Final Clinical Impression(s) / ED Diagnoses Final diagnoses:  Uncontrolled hypertension    Rx / DC Orders ED Discharge Orders          Ordered    lisinopril (ZESTRIL) 10 MG tablet  Daily        08/14/22 1817  Eber Hong, MD 08/14/22 [phone]

## 2022-08-17 ENCOUNTER — Ambulatory Visit (HOSPITAL_COMMUNITY)
Admission: RE | Admit: 2022-08-17 | Discharge: 2022-08-17 | Disposition: A | Discharge status: Home / Self Care | Payer: Medicare PPO | Source: Ambulatory Visit | Attending: Family Medicine | Admitting: Family Medicine

## 2022-08-17 DIAGNOSIS — M810 Age-related osteoporosis without current pathological fracture: Secondary | ICD-10-CM | POA: Diagnosis not present

## 2022-08-17 DIAGNOSIS — Z13820 Encounter for screening for osteoporosis: Secondary | ICD-10-CM | POA: Diagnosis present

## 2022-08-17 DIAGNOSIS — Z780 Asymptomatic menopausal state: Secondary | ICD-10-CM | POA: Diagnosis not present

## 2022-08-17 DIAGNOSIS — F17200 Nicotine dependence, unspecified, uncomplicated: Secondary | ICD-10-CM | POA: Diagnosis not present

## 2022-08-17 LAB — CMP14+EGFR
ALT: 9 IU/L (ref 0–32)
AST: 11 IU/L (ref 0–40)
Albumin: 4.2 g/dL (ref 3.8–4.8)
Alkaline Phosphatase: 73 IU/L (ref 44–121)
BUN/Creatinine Ratio: 15 (ref 12–28)
BUN: 12 mg/dL (ref 8–27)
Bilirubin Total: 1 mg/dL (ref 0.0–1.2)
CO2: 23 mmol/L (ref 20–29)
Calcium: 9.5 mg/dL (ref 8.7–10.3)
Chloride: 105 mmol/L (ref 96–106)
Creatinine, Ser: 0.79 mg/dL (ref 0.57–1.00)
Globulin, Total: 2.3 g/dL (ref 1.5–4.5)
Glucose: 93 mg/dL (ref 70–99)
Potassium: 4.6 mmol/L (ref 3.5–5.2)
Sodium: 141 mmol/L (ref 134–144)
Total Protein: 6.5 g/dL (ref 6.0–8.5)
eGFR: 76 mL/min/{1.73_m2} (ref >59)

## 2022-08-17 LAB — CBC WITH DIFFERENTIAL/PLATELET
Basophils Absolute: 0.1 10*3/uL (ref 0.0–0.2)
Basos: 1 %
EOS (ABSOLUTE): 0.3 10*3/uL (ref 0.0–0.4)
Eos: 4 %
Hematocrit: 40.8 % (ref 34.0–46.6)
Hemoglobin: 13.5 g/dL (ref 11.1–15.9)
Immature Grans (Abs): 0 10*3/uL (ref 0.0–0.1)
Immature Granulocytes: 0 %
Lymphocytes Absolute: 1.9 10*3/uL (ref 0.7–3.1)
Lymphs: 21 %
MCH: 30.4 pg (ref 26.6–33.0)
MCHC: 33.1 g/dL (ref 31.5–35.7)
MCV: 92 fL (ref 79–97)
Monocytes Absolute: 0.5 10*3/uL (ref 0.1–0.9)
Monocytes: 5 %
Neutrophils Absolute: 6 10*3/uL (ref 1.4–7.0)
Neutrophils: 69 %
Platelets: 359 10*3/uL (ref 150–450)
RBC: 4.44 x10E6/uL (ref 3.77–5.28)
RDW: 13.4 % (ref 11.7–15.4)
WBC: 8.7 10*3/uL (ref 3.4–10.8)

## 2022-08-17 LAB — LIPID PANEL
Chol/HDL Ratio: 3.4 ratio (ref 0.0–4.4)
Cholesterol, Total: 205 mg/dL — ABNORMAL HIGH (ref 100–199)
HDL: 61 mg/dL (ref >39)
LDL Chol Calc (NIH): 124 mg/dL — ABNORMAL HIGH (ref 0–99)
Triglycerides: 114 mg/dL (ref 0–149)
VLDL Cholesterol Cal: 20 mg/dL (ref 5–40)

## 2022-08-17 LAB — TSH+FREE T4
Free T4: 1.14 ng/dL (ref 0.82–1.77)
TSH: 1.97 u[IU]/mL (ref 0.450–4.500)

## 2022-08-17 LAB — HIV ANTIBODY (ROUTINE TESTING W REFLEX): HIV Screen 4th Generation wRfx: NONREACTIVE

## 2022-08-17 LAB — HEMOGLOBIN A1C
Est. average glucose Bld gHb Est-mCnc: 114 mg/dL
Hgb A1c MFr Bld: 5.6 % (ref 4.8–5.6)

## 2022-08-17 LAB — VITAMIN D 25 HYDROXY (VIT D DEFICIENCY, FRACTURES): Vit D, 25-Hydroxy: 15.1 ng/mL — ABNORMAL LOW (ref 30.0–100.0)

## 2022-08-17 LAB — HEPATITIS C ANTIBODY: Hep C Virus Ab: NONREACTIVE

## 2022-08-18 ENCOUNTER — Other Ambulatory Visit: Payer: Self-pay | Admitting: Family Medicine

## 2022-08-18 DIAGNOSIS — E7849 Other hyperlipidemia: Secondary | ICD-10-CM

## 2022-08-18 DIAGNOSIS — M810 Age-related osteoporosis without current pathological fracture: Secondary | ICD-10-CM

## 2022-08-18 DIAGNOSIS — E559 Vitamin D deficiency, unspecified: Secondary | ICD-10-CM

## 2022-08-18 MED ORDER — VITAMIN D (ERGOCALCIFEROL) 1.25 MG (50000 UNIT) PO CAPS
50000.0000 [IU] | ORAL_CAPSULE | ORAL | 1 refills | Status: DC
Start: 2022-08-18 — End: 2022-09-18

## 2022-08-18 MED ORDER — ROSUVASTATIN CALCIUM 10 MG PO TABS
10.0000 mg | ORAL_TABLET | Freq: Every day | ORAL | 3 refills | Status: DC
Start: 2022-08-18 — End: 2023-05-30

## 2022-08-18 MED ORDER — ALENDRONATE SODIUM 70 MG PO TABS
70.0000 mg | ORAL_TABLET | ORAL | 2 refills | Status: DC
Start: 2022-08-18 — End: 2023-05-30

## 2022-08-18 NOTE — Progress Notes (Signed)
The 10-year ASCVD risk score (Arnett DK, et al., 2019) is: 62.6%   Values used to calculate the score:     Age: 82 years     Sex: Female     Is Non-Hispanic African American: No     Diabetic: No     Tobacco smoker: Yes     Systolic Blood Pressure: 177 mmHg     Is BP treated: Yes     HDL Cholesterol: 61 mg/dL     Total Cholesterol: 205 mg/dL

## 2022-08-18 NOTE — Progress Notes (Signed)
Please inform the patient that her dexa scan indicated that she has osteoporosis.  We will repeat screening  every 2 years.   I have started treatment for osteoporosis to decrease the risk of fractures and increase bone mass. The prescription for fosamax 70 mg weekly is sent to her pharmacy.   Please advise the patient to take the medication first thing in the morning and wait 30 minutes before the first food, stay upright (not to lie down) after taking fosamax for 30 minutes and until after first food of the day (to reduce esophageal irritation). Do not the mediation with other beverage (except plain water), or other medication(s) of the day.     I recommend increasing her physical activities and taking OTC calcium supplements of 1000 mg daily.

## 2022-08-18 NOTE — Progress Notes (Signed)
A weekly vitamin D supplement prescription has been sent to your pharmacy because your vitamin D is low.   Your cholesterol levels are elevated. I want your LDL to be less than 100. I recommend avoiding simple carbohydrates including cakes, sweet desserts, ice cream, soda (diet or regular), sweet tea, candies, chips, cookies, store-bought juices, alcohol in excess of 1-2 drinks a day, lemonade, artificial sweeteners, donuts, coffee creamers, and sugar-free products.  I recommend avoiding greasy, fatty foods with increased physical activity.  A prescription for rosuvastatin 10 mg has been sent to your pharmacy to help decrease your cholesterol levels.  Your thyroid, kidneys, and liver function are stable.

## 2022-08-21 ENCOUNTER — Ambulatory Visit (INDEPENDENT_AMBULATORY_CARE_PROVIDER_SITE_OTHER): Payer: Medicare PPO | Admitting: Internal Medicine

## 2022-08-21 ENCOUNTER — Encounter: Payer: Self-pay | Admitting: Internal Medicine

## 2022-08-21 VITALS — BP 163/73 | HR 67 | Resp 16 | Ht 62.0 in | Wt 148.8 lb

## 2022-08-21 DIAGNOSIS — I10 Essential (primary) hypertension: Secondary | ICD-10-CM

## 2022-08-21 MED ORDER — LISINOPRIL 10 MG PO TABS: 10.0000 mg | ORAL_TABLET | Freq: Every day | ORAL | 1 refills | Status: DC

## 2022-08-21 MED ORDER — AMLODIPINE BESYLATE 5 MG PO TABS
5.0000 mg | ORAL_TABLET | Freq: Every day | ORAL | 0 refills | Status: AC
Start: 2022-08-21 — End: ?

## 2022-08-21 NOTE — Progress Notes (Unsigned)
   HPI:[name] is a 82 y.o. female who presents for evaluation of Hypertension (Follow up from ER. Blood pressure was very elevated. Needs lisinopril refilled for a 90 day supply ) . For the details of today's visit, please refer to the assessment and plan.  Physical Exam: Vitals:   08/21/22 1430  BP: (!) 163/73  Pulse: 67  Resp: 16  SpO2: 96%  Weight: 148 lb 12.8 oz (67.5 kg)  Height: 5\' 2"  (1.575 m)     Physical Exam Constitutional:      Appearance: She is well-developed and well-groomed.  Eyes:     General: No scleral icterus.    Conjunctiva/sclera: Conjunctivae normal.  Cardiovascular:     Rate and Rhythm: Normal rate and regular rhythm.     Heart sounds: No murmur heard.    No friction rub. No gallop.  Pulmonary:     Effort: Pulmonary effort is normal.     Breath sounds: No wheezing, rhonchi or rales.  Musculoskeletal:     Right lower leg: No edema.     Left lower leg: No edema.  Skin:    General: Skin is warm and dry.      Assessment & Plan:   Charlotte Rich was seen today for hypertension.  Primary hypertension Assessment & Plan: Patient has history of HTN, but was off all medications. 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 She recently restablished and was found to have SBP in 200's . She was sent to ED. No significant symptoms from HTN noted. Started on lisinopril in ED and discharged. Here for follow up.  Patient's BP today is 163/73 with a goal of <140/80. The patient endorses adherence to lisinopril 10 mg. No headache , visual changes, lightheadedness, weakness, dizziness on standing, or swelling in the feet or ankles.   Chronic problem, uncontrolled Plan: Continue Lisinopril 10 mg Start amlodipine 5 mg Follow up in 4 weeks for [place] to check your blood pressure and do lab work.     Other orders -     Lisinopril; Take 1 tablet (10 mg total) by mouth daily.  Dispense: 90 tablet; Refill: 1 -     amLODIPine Besylate; Take  1 tablet (5 mg total) by mouth daily.  Dispense: 90 tablet; Refill: 0      Milus Banister, MD

## 2022-08-21 NOTE — Patient Instructions (Addendum)
Thank you, Charlotte Rich for allowing [place] to provide your care today.    BP today is 163/73 with a goal of <140/80.  Continue Lisinopril 10 mg Start amlodipine 5 mg Follow up in 4 weeks for [place] to check your blood pressure and do lab work.      Thurmon Fair, M.D.

## 2022-08-21 NOTE — Assessment & Plan Note (Signed)
Patient has history of HTN, but was off all medications. 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 She recently restablished and was found to have SBP in 200's . She was sent to ED. No significant symptoms from HTN noted. Started on lisinopril in ED and discharged. Here for follow up.  Patient's BP today is 163/73 with a goal of <140/80. The patient endorses adherence to lisinopril 10 mg. No headache , visual changes, lightheadedness, weakness, dizziness on standing, or swelling in the feet or ankles.   Chronic problem, uncontrolled Plan: Continue Lisinopril 10 mg Start amlodipine 5 mg Follow up in 4 weeks for [place] to check your blood pressure and do lab work.

## 2022-09-18 ENCOUNTER — Encounter: Payer: Self-pay | Admitting: Family Medicine

## 2022-09-18 ENCOUNTER — Ambulatory Visit: Payer: Medicare PPO | Admitting: Family Medicine

## 2022-09-18 VITALS — BP 116/68 | HR 74 | Ht 62.0 in | Wt 147.6 lb

## 2022-09-18 DIAGNOSIS — I10 Essential (primary) hypertension: Secondary | ICD-10-CM

## 2022-09-18 DIAGNOSIS — E559 Vitamin D deficiency, unspecified: Secondary | ICD-10-CM

## 2022-09-18 DIAGNOSIS — E7849 Other hyperlipidemia: Secondary | ICD-10-CM | POA: Diagnosis not present

## 2022-09-18 MED ORDER — VITAMIN D (ERGOCALCIFEROL) 1.25 MG (50000 UNIT) PO CAPS
50000.0000 [IU] | ORAL_CAPSULE | ORAL | 1 refills | Status: DC
Start: 2022-09-18 — End: 2023-05-30

## 2022-09-18 NOTE — Patient Instructions (Addendum)
Please schedule medicare annual wellness  I appreciate the opportunity to provide care to you today!    Follow up:  3 months  Labs: bmp+egfr  Screening: HIV and Hep C  Please aim to maintain your blood pressure below 140/90 mm Hg. Continue taking lisinopril 10 mg daily and amlodipine 5 mg daily. Additionally, bring your blood pressure log to your follow-up appointment.   Blood pressure is classified into four stages. Based on your blood pressure reading, your health care provider may use the following stages to determine what type of treatment you need, if any. Systolic pressure and diastolic pressure are measured in a unit called millimeters of mercury (mmHg). Normal Systolic pressure: below 120. Diastolic pressure: below 80. Elevated Systolic pressure: 120-129. Diastolic pressure: below 80. Hypertension stage 1 Systolic pressure: 130-139. Diastolic pressure: 80-89. Hypertension stage 2 Systolic pressure: 140 or above. Diastolic pressure: 90 or above.    Normal Heart Rate Resting Heart Rate: Typically 60 to 100 beats per minute (bpm) for most adults.  Low Heart Rate (Bradycardia) Resting Heart Rate: Less than 60 bpm. Symptoms: May include dizziness, fatigue, shortness of breath, or fainting, though some individuals, especially athletes, may have naturally lower heart rates without symptoms.  Elevated Heart Rate (Tachycardia) Resting Heart Rate: Greater than 100 bpm. Types: Sinus Tachycardia: A normal increase in heart rate due to exercise, stress, or fever. Supraventricular Tachycardia (SVT): Rapid heart rate originating above the ventricles. Ventricular Tachycardia: A rapid heart rate originating in the ventricles, potentially more concerning.    Attached with your AVS, you will find valuable resources for self-education. I highly recommend dedicating some time to thoroughly examine them.   Please continue to a heart-healthy diet and increase your physical  activities. Try to exercise for at least five days a week.    It was a pleasure to see you and I look forward to continuing to work together on your health and well-being. Please do not hesitate to call the office if you need care or have questions about your care.  In case of emergency, please visit the Emergency Department for urgent care, or contact our clinic at [phone] to schedule an appointment. We're here to help you!   Have a wonderful day and week. With Gratitude, Gilmore Laroche MSN, FNP-BC

## 2022-09-18 NOTE — Progress Notes (Signed)
Established Patient Office Visit  Subjective:  Patient ID: Charlotte Rich, female    DOB: [birth year]  Age: 82 y.o. MRN: [id number]  CC:  Chief Complaint  Patient presents with   Hypertension    Bp check. Has concerns about what's normal bp and what's normal pulse.    Headache    Pt reports temporal headache only when she wakes up last 30 min or less.     HPI Charlotte Rich is a 82 y.o. female with past medical history of hypertension presents for  BP f/u.  For the details of today's visit, please refer to the assessment and plan.    Past Medical History:  Diagnosis Date   Hypertension     History reviewed. No pertinent surgical history.  History reviewed. No pertinent family history.  Social History   Socioeconomic History   Marital status: Married    Spouse name: Not on file   Number of children: Not on file   Years of education: Not on file   Highest education level: Not on file  Occupational History   Not on file  Tobacco Use   Smoking status: Every Day    Types: Cigarettes   Smokeless tobacco: Never   Tobacco comments:    8 cig a day  Substance and Sexual Activity   Alcohol use: Yes    Comment: occ   Drug use: Not Currently   Sexual activity: Not Currently  Other Topics Concern   Not on file  Social History Narrative   Not on file   Social Determinants of Health   Financial Resource Strain: Not on file  Food Insecurity: Not on file  Transportation Needs: Not on file  Physical Activity: Not on file  Stress: Not on file  Social Connections: Not on file  Intimate Partner Violence: Not on file    Outpatient Medications Prior to Visit  Medication Sig Dispense Refill   alendronate (FOSAMAX) 70 MG tablet Take 1 tablet (70 mg total) by mouth every 7 (seven) days. Take with a full glass of water on an empty stomach. 20 tablet 2   amLODipine (NORVASC) 5 MG tablet Take 1 tablet (5 mg total) by mouth daily. 90 tablet 0   lisinopril (ZESTRIL) 10 MG tablet  Take 1 tablet (10 mg total) by mouth daily. 90 tablet 1   rosuvastatin (CRESTOR) 10 MG tablet Take 1 tablet (10 mg total) by mouth daily. 90 tablet 3   Vitamin D, Ergocalciferol, (DRISDOL) 1.25 MG (50000 UNIT) CAPS capsule Take 1 capsule (50,000 Units total) by mouth every 7 (seven) days. 20 capsule 1   No facility-administered medications prior to visit.    No Known Allergies  ROS Review of Systems  Constitutional:  Negative for chills and fever.  Eyes:  Negative for visual disturbance.  Respiratory:  Negative for chest tightness and shortness of breath.   Neurological:  Negative for dizziness and headaches.      Objective:    Physical Exam HENT:     Head: Normocephalic.     Mouth/Throat:     Mouth: Mucous membranes are moist.  Cardiovascular:     Rate and Rhythm: Normal rate.     Heart sounds: Normal heart sounds.  Pulmonary:     Effort: Pulmonary effort is normal.     Breath sounds: Normal breath sounds.  Neurological:     Mental Status: She is alert.     BP 116/68   Pulse 74   Ht 5\' 2"  (  1.575 m)   Wt 147 lb 9.6 oz (67 kg)   SpO2 99%   BMI 27.00 kg/m  Wt Readings from Last 3 Encounters:  09/18/22 147 lb 9.6 oz (67 kg)  08/21/22 148 lb 12.8 oz (67.5 kg)  08/14/22 147 lb (66.7 kg)    Lab Results  Component Value Date   TSH 1.970 08/16/2022   Lab Results  Component Value Date   WBC 8.7 08/16/2022   HGB 13.5 08/16/2022   HCT 40.8 08/16/2022   MCV 92 08/16/2022   PLT 359 08/16/2022   Lab Results  Component Value Date   NA 142 09/18/2022   K 3.9 09/18/2022   CO2 25 09/18/2022   GLUCOSE 96 09/18/2022   BUN 11 09/18/2022   CREATININE 0.67 09/18/2022   BILITOT 1.0 08/16/2022   ALKPHOS 73 08/16/2022   AST 11 08/16/2022   ALT 9 08/16/2022   PROT 6.5 08/16/2022   ALBUMIN 4.2 08/16/2022   CALCIUM 9.9 09/18/2022   ANIONGAP 10 08/14/2022   EGFR 89 09/18/2022   Lab Results  Component Value Date   CHOL 205 (H) 08/16/2022   Lab Results  Component  Value Date   HDL 61 08/16/2022   Lab Results  Component Value Date   LDLCALC 124 (H) 08/16/2022   Lab Results  Component Value Date   TRIG 114 08/16/2022   Lab Results  Component Value Date   CHOLHDL 3.4 08/16/2022   Lab Results  Component Value Date   HGBA1C 5.6 08/16/2022      Assessment & Plan:  Hypertension, unspecified type Assessment & Plan: Controlled She takes lisinopril 10 mg and amlodipine 5 mg daily Asymptomatic C/o  mild temporal headache when she wakes up in the morning Headache resolve after taking her BP meds Reviewed normal BP and HR Encouraged low sodium diet with increased physical activity BP Readings from Last 3 Encounters:  09/18/22 116/68  08/21/22 (!) 163/73  08/14/22 (!) 177/89      Orders: -     BMP8+EGFR  Vitamin D deficiency -     Vitamin D (Ergocalciferol); Take 1 capsule (50,000 Units total) by mouth every 7 (seven) days.  Dispense: 20 capsule; Refill: 1  Other hyperlipidemia  Note: This chart has been completed using Engineer, civil (consulting) software, and while attempts have been made to ensure accuracy, certain words and phrases may not be transcribed as intended.    Follow-up: Return in about 3 months (around 12/19/2022).   Charlotte Laroche, FNP

## 2022-09-20 NOTE — Progress Notes (Signed)
Please inform the patient that her labs are stable

## 2022-09-21 NOTE — Assessment & Plan Note (Signed)
Controlled She takes lisinopril 10 mg and amlodipine 5 mg daily Asymptomatic C/o  mild temporal headache when she wakes up in the morning Headache resolve after taking her BP meds Reviewed normal BP and HR Encouraged low sodium diet with increased physical activity BP Readings from Last 3 Encounters:  09/18/22 116/68  08/21/22 (!) 163/73  08/14/22 (!) 177/89

## 2022-10-24 ENCOUNTER — Ambulatory Visit: Payer: Medicare PPO

## 2022-10-24 VITALS — Ht 62.0 in | Wt 146.0 lb

## 2022-10-24 DIAGNOSIS — Z0000 Encounter for general adult medical examination without abnormal findings: Secondary | ICD-10-CM

## 2022-10-24 NOTE — Progress Notes (Signed)
Subjective:   Charlotte Rich is a 82 y.o. female who presents for an Initial Medicare Annual Wellness Visit.  Visit Complete: Virtual  I connected with  Charlotte Rich on 10/24/22 by a audio enabled telemedicine application and verified that I am speaking with the correct person using two identifiers.  Patient Location: Home  Provider Location: Home Office  I discussed the limitations of evaluation and management by telemedicine. The patient expressed understanding and agreed to proceed.  Patient Medicare AWV questionnaire was completed by the patient on 10/24/2022; I have confirmed that all information answered by patient is correct and no changes since this date.  Cardiac Risk Factors include: advanced age (>64men, >49 women);dyslipidemia;hypertension;smoking/ tobacco exposure;sedentary lifestyleVital Signs: Unable to obtain new vitals due to this being a telehealth visit.      Objective:    Today's Vitals   10/24/22 1439  Weight: 146 lb (66.2 kg)  Height: 5\' 2"  (1.575 m)   Body mass index is 26.7 kg/m.     10/24/2022    2:42 PM 08/14/2022    5:10 PM  Advanced Directives  Does Patient Have a Medical Advance Directive? Yes Yes  Type of Estate agent of [place];Living will Healthcare Power of [place];Living will  Copy of Healthcare Power of Attorney in Chart? No - copy requested     Current Medications (verified) Outpatient Encounter Medications as of 10/24/2022  Medication Sig   alendronate (FOSAMAX) 70 MG tablet Take 1 tablet (70 mg total) by mouth every 7 (seven) days. Take with a full glass of water on an empty stomach.   amLODipine (NORVASC) 5 MG tablet Take 1 tablet (5 mg total) by mouth daily.   lisinopril (ZESTRIL) 10 MG tablet Take 1 tablet (10 mg total) by mouth daily.   rosuvastatin (CRESTOR) 10 MG tablet Take 1 tablet (10 mg total) by mouth daily.   Vitamin D, Ergocalciferol, (DRISDOL) 1.25 MG (50000 UNIT) CAPS capsule Take 1 capsule  (50,000 Units total) by mouth every 7 (seven) days.   No facility-administered encounter medications on file as of 10/24/2022.    Allergies (verified) Patient has no known allergies.   History: Past Medical History:  Diagnosis Date   Hypertension    History reviewed. No pertinent surgical history. History reviewed. No pertinent family history. Social History   Socioeconomic History   Marital status: Married    Spouse name: Not on file   Number of children: Not on file   Years of education: Not on file   Highest education level: Not on file  Occupational History   Not on file  Tobacco Use   Smoking status: Every Day    Types: Cigarettes   Smokeless tobacco: Never   Tobacco comments:    8 cig a day  Substance and Sexual Activity   Alcohol use: Yes    Comment: occ   Drug use: Not Currently   Sexual activity: Not Currently  Other Topics Concern   Not on file  Social History Narrative   Not on file   Social Determinants of Health   Financial Resource Strain: Low Risk  (10/24/2022)   Overall Financial Resource Strain (CARDIA)    Difficulty of Paying Living Expenses: Not hard at all  Food Insecurity: No Food Insecurity (10/24/2022)   Hunger Vital Sign    Worried About Running Out of Food in the Last Year: Never true    Ran Out of Food in the Last Year: Never true  Transportation Needs: No  Transportation Needs (10/24/2022)   PRAPARE - Administrator, Civil Service (Medical): No    Lack of Transportation (Non-Medical): No  Physical Activity: Inactive (10/24/2022)   Exercise Vital Sign    Days of Exercise per Week: 0 days    Minutes of Exercise per Session: 0 min  Stress: No Stress Concern Present (10/24/2022)   [employer] of Occupational Health - Occupational Stress Questionnaire    Feeling of Stress : Not at all  Social Connections: Socially Isolated (10/24/2022)   Social Connection and Isolation Panel [NHANES]    Frequency of Communication with  Friends and Family: More than three times a week    Frequency of Social Gatherings with Friends and Family: More than three times a week    Attends Religious Services: Never    Database administrator or Organizations: No    Attends Banker Meetings: Never    Marital Status: Widowed    Tobacco Counseling Ready to quit: Not Answered Counseling given: Not Answered Tobacco comments: 8 cig a day   Clinical Intake:  Pre-visit preparation completed: Yes  Pain : No/denies pain     Nutritional Risks: None Diabetes: No  How often do you need to have someone help you when you read instructions, pamphlets, or other written materials from your doctor or pharmacy?: 1 - Never  Interpreter Needed?: No  Information entered by :: Renie Ora, LPN   Activities of Daily Living    10/24/2022    2:42 PM  In your present state of health, do you have any difficulty performing the following activities:  Hearing? 0  Vision? 0  Difficulty concentrating or making decisions? 0  Walking or climbing stairs? 0  Dressing or bathing? 0  Doing errands, shopping? 0  Preparing Food and eating ? N  Using the Toilet? N  In the past six months, have you accidently leaked urine? N  Do you have problems with loss of bowel control? N  Managing your Medications? N  Managing your Finances? N  Housekeeping or managing your Housekeeping? N    Patient Care Team: Gilmore Laroche, FNP as PCP - General (Family Medicine)  Indicate any recent Medical Services you may have received from other than [facility] providers in the past year (date may be approximate).     Assessment:   This is a routine wellness examination for Charlotte Rich.  Hearing/Vision screen Vision Screening - Comments:: Wears rx glasses - up to date with routine eye exams with  Dr.Groat    Goals Addressed             This Visit's Progress    Exercise 3x per week (30 min per time)         Depression Screen    10/24/2022    2:41  PM 09/18/2022    1:29 PM 08/21/2022    2:31 PM 08/14/2022    4:06 PM  PHQ 2/9 Scores  PHQ - 2 Score 0 0 0 0  PHQ- 9 Score 0 0  1    Fall Risk    10/24/2022    2:40 PM 09/18/2022    1:29 PM 08/21/2022    2:31 PM 08/14/2022    4:06 PM  Fall Risk   Falls in the past year? 0 0 0 0  Number falls in past yr: 0 0 0 0  Injury with Fall? 0 0 0 0  Risk for fall due to : No Fall Risks No Fall Risks  No  Fall Risks  Follow up Falls prevention discussed Falls evaluation completed  Falls evaluation completed    MEDICARE RISK AT HOME: Medicare Risk at Home Any stairs in or around the home?: Yes If so, are there any without handrails?: No Home free of loose throw rugs in walkways, pet beds, electrical cords, etc?: Yes Adequate lighting in your home to reduce risk of falls?: Yes Life alert?: No Use of a cane, walker or w/c?: No Grab bars in the bathroom?: Yes Shower chair or bench in shower?: Yes Elevated toilet seat or a handicapped toilet?: Yes  TIMED UP AND GO:  Was the test performed? No    Cognitive Function:        10/24/2022    2:42 PM  6CIT Screen  What Year? 0 points  What month? 0 points  What time? 0 points  Count back from 20 0 points  Months in reverse 0 points  Repeat phrase 0 points  Total Score 0 points    Immunizations  There is no immunization history on file for this patient.  TDAP status: Due, Education has been provided regarding the importance of this vaccine. Advised may receive this vaccine at local pharmacy or Health Dept. Aware to provide a copy of the vaccination record if obtained from local pharmacy or Health Dept. Verbalized acceptance and understanding.  Flu Vaccine status: Due, Education has been provided regarding the importance of this vaccine. Advised may receive this vaccine at local pharmacy or Health Dept. Aware to provide a copy of the vaccination record if obtained from local pharmacy or Health Dept. Verbalized acceptance and  understanding.  Pneumococcal vaccine status: Due, Education has been provided regarding the importance of this vaccine. Advised may receive this vaccine at local pharmacy or Health Dept. Aware to provide a copy of the vaccination record if obtained from local pharmacy or Health Dept. Verbalized acceptance and understanding.  Covid-19 vaccine status: Declined, Education has been provided regarding the importance of this vaccine but patient still declined. Advised may receive this vaccine at local pharmacy or Health Dept.or vaccine clinic. Aware to provide a copy of the vaccination record if obtained from local pharmacy or Health Dept. Verbalized acceptance and understanding.  Qualifies for Shingles Vaccine? Yes   Zostavax completed No   Shingrix Completed?: No.    Education has been provided regarding the importance of this vaccine. Patient has been advised to call insurance company to determine out of pocket expense if they have not yet received this vaccine. Advised may also receive vaccine at local pharmacy or Health Dept. Verbalized acceptance and understanding.  Screening Tests Health Maintenance  Topic Date Due   Pneumonia Vaccine 3+ Years old (1 of 2 - PCV) Never done   DTaP/Tdap/Td (1 - Tdap) Never done   Zoster Vaccines- Shingrix (1 of 2) Never done   INFLUENZA VACCINE  Never done   COVID-19 Vaccine (1 - 2023-24 season) Never done   Medicare Annual Wellness (AWV)  10/24/2023   DEXA SCAN  Completed   HPV VACCINES  Aged Out   Hepatitis C Screening  Discontinued    Health Maintenance  Health Maintenance Due  Topic Date Due   Pneumonia Vaccine 75+ Years old (1 of 2 - PCV) Never done   DTaP/Tdap/Td (1 - Tdap) Never done   Zoster Vaccines- Shingrix (1 of 2) Never done   INFLUENZA VACCINE  Never done   COVID-19 Vaccine (1 - 2023-24 season) Never done    Colorectal cancer screening: No longer  required.   Mammogram status: No longer required due to age.  Bone Density status:  Completed 08/17/2022. Results reflect: Bone density results: OSTEOPOROSIS. Repeat every 2 years.  Lung Cancer Screening: (Low Dose CT Chest recommended if Age 19-80 years, 20 pack-year currently smoking OR have quit w/in 15years.) does not qualify.   Lung Cancer Screening Referral: n/a  Additional Screening:  Hepatitis C Screening: does not qualify;   Vision Screening: Recommended annual ophthalmology exams for early detection of glaucoma and other disorders of the eye. Is the patient up to date with their annual eye exam?  No  Who is the provider or what is the name of the office in which the patient attends annual eye exams? Dr.groat  If pt is not established with a provider, would they like to be referred to a provider to establish care? No .   Dental Screening: Recommended annual dental exams for proper oral hygiene   Community Resource Referral / Chronic Care Management: CRR required this visit?  No   CCM required this visit?  No     Plan:     I have personally reviewed and noted the following in the patient's chart:   Medical and social history Use of alcohol, tobacco or illicit drugs  Current medications and supplements including opioid prescriptions. Patient is not currently taking opioid prescriptions. Functional ability and status Nutritional status Physical activity Advanced directives List of other physicians Hospitalizations, surgeries, and ER visits in previous 12 months Vitals Screenings to include cognitive, depression, and falls Referrals and appointments  In addition, I have reviewed and discussed with patient certain preventive protocols, quality metrics, and best practice recommendations. A written personalized care plan for preventive services as well as general preventive health recommendations were provided to patient.     Lorrene Reid, LPN   0/09/6759   After Visit Summary: (MyChart) Due to this being a telephonic visit, the after visit  summary with patients personalized plan was offered to patient via MyChart   Nurse Notes: none

## 2022-10-24 NOTE — Patient Instructions (Signed)
Charlotte Rich , Thank you for taking time to come for your Medicare Wellness Visit. I appreciate your ongoing commitment to your health goals. Please review the following plan we discussed and let me know if I can assist you in the future.   Referrals/Orders/Follow-Ups/Clinician Recommendations: Aim for 30 minutes of exercise or brisk walking, 6-8 glasses of water, and 5 servings of fruits and vegetables each day.   This is a list of the screening recommended for you and due dates:  Health Maintenance  Topic Date Due   Pneumonia Vaccine (1 of 2 - PCV) Never done   DTaP/Tdap/Td vaccine (1 - Tdap) Never done   Zoster (Shingles) Vaccine (1 of 2) Never done   Flu Shot  Never done   COVID-19 Vaccine (1 - 2023-24 season) Never done   Medicare Annual Wellness Visit  10/24/2023   DEXA scan (bone density measurement)  Completed   HPV Vaccine  Aged Out   Hepatitis C Screening  Discontinued    Advanced directives: (Copy Requested) Please bring a copy of your health care power of attorney and living will to the office to be added to your chart at your convenience.  Next Medicare Annual Wellness Visit scheduled for next year: Yes  Insert Preventive Care attachment Insert FALL PREVENTION attachment if needed

## 2022-11-08 ENCOUNTER — Other Ambulatory Visit: Payer: Self-pay

## 2022-11-08 ENCOUNTER — Telehealth: Payer: Self-pay | Admitting: Family Medicine

## 2022-11-08 MED ORDER — AMLODIPINE BESYLATE 5 MG PO TABS: 5.0000 mg | ORAL_TABLET | Freq: Every day | ORAL | 0 refills | Status: DC

## 2022-11-08 NOTE — Telephone Encounter (Signed)
Refill sent 10/3

## 2022-11-08 NOTE — Telephone Encounter (Signed)
Patient called needs refill on meds before she leaves out of town. Was given to by Dr Barbaraann Faster when he was in office.   amLODipine (NORVASC) 5 MG tablet [[id number]]   Pharmacy  [facility] [phone] - [place], [place] - [address] AT [facility] [address], [place] [address] Phone: [phone]  Fax: [phone]

## 2022-12-19 ENCOUNTER — Encounter: Payer: Self-pay | Admitting: Family Medicine

## 2022-12-19 ENCOUNTER — Ambulatory Visit: Payer: Medicare PPO | Admitting: Family Medicine

## 2022-12-19 VITALS — BP 132/75 | HR 76 | Wt 146.1 lb

## 2022-12-19 DIAGNOSIS — R7301 Impaired fasting glucose: Secondary | ICD-10-CM

## 2022-12-19 DIAGNOSIS — S39012A Strain of muscle, fascia and tendon of lower back, initial encounter: Secondary | ICD-10-CM | POA: Diagnosis not present

## 2022-12-19 DIAGNOSIS — E038 Other specified hypothyroidism: Secondary | ICD-10-CM | POA: Diagnosis not present

## 2022-12-19 DIAGNOSIS — E7849 Other hyperlipidemia: Secondary | ICD-10-CM | POA: Diagnosis not present

## 2022-12-19 DIAGNOSIS — E7800 Pure hypercholesterolemia, unspecified: Secondary | ICD-10-CM

## 2022-12-19 DIAGNOSIS — E559 Vitamin D deficiency, unspecified: Secondary | ICD-10-CM | POA: Diagnosis not present

## 2022-12-19 DIAGNOSIS — I10 Essential (primary) hypertension: Secondary | ICD-10-CM | POA: Diagnosis not present

## 2022-12-19 DIAGNOSIS — T148XXA Other injury of unspecified body region, initial encounter: Secondary | ICD-10-CM | POA: Insufficient documentation

## 2022-12-19 DIAGNOSIS — Z23 Encounter for immunization: Secondary | ICD-10-CM | POA: Diagnosis not present

## 2022-12-19 NOTE — Progress Notes (Signed)
Established Patient Office Visit  Subjective:  Patient ID: Charlotte Rich, female    DOB: [birth year]  Age: 82 y.o. MRN: [id number]  CC:  Chief Complaint  Patient presents with   Care Management    3 month f/u, reports side pain on right side radiating to her back happened 3 weeks ago.     HPI Charlotte Rich is a 82 y.o. female with past medical history of hypertension, hyperlipidemia, vit d deficiency presents for f/u of  chronic medical conditions.  Muscle Strain: The patient complains of pain in the right upper quadrant and right lower back. She notes that the pain often occurs at bedtime and is noticeable when she lies on her left side to sleep. Pain is rated 5 out of 10 and is described as dull and sore. She denies sharp or cramping sensations in her abdomen, and there are no reports of nausea, vomiting, changes in bowel or bladder habits, fever, chills, heartburn, or indigestion symptoms. She mentions that she frequently slouches when sitting. No heavy lifting or bending reported. The patient reports that the pain is often relieved with Tylenol and is not triggered by fatty foods or eating habits.    Past Medical History:  Diagnosis Date   Hypertension     History reviewed. No pertinent surgical history.  History reviewed. No pertinent family history.  Social History   Socioeconomic History   Marital status: Married    Spouse name: Not on file   Number of children: Not on file   Years of education: Not on file   Highest education level: Not on file  Occupational History   Not on file  Tobacco Use   Smoking status: Every Day    Types: Cigarettes   Smokeless tobacco: Never   Tobacco comments:    8 cig a day  Substance and Sexual Activity   Alcohol use: Yes    Comment: occ   Drug use: Not Currently   Sexual activity: Not Currently  Other Topics Concern   Not on file  Social History Narrative   Not on file   Social Determinants of Health   Financial Resource  Strain: Low Risk  (10/24/2022)   Overall Financial Resource Strain (CARDIA)    Difficulty of Paying Living Expenses: Not hard at all  Food Insecurity: No Food Insecurity (10/24/2022)   Hunger Vital Sign    Worried About Running Out of Food in the Last Year: Never true    Ran Out of Food in the Last Year: Never true  Transportation Needs: No Transportation Needs (10/24/2022)   PRAPARE - Administrator, Civil Service (Medical): No    Lack of Transportation (Non-Medical): No  Physical Activity: Inactive (10/24/2022)   Exercise Vital Sign    Days of Exercise per Week: 0 days    Minutes of Exercise per Session: 0 min  Stress: No Stress Concern Present (10/24/2022)   [employer] of Occupational Health - Occupational Stress Questionnaire    Feeling of Stress : Not at all  Social Connections: Socially Isolated (10/24/2022)   Social Connection and Isolation Panel [NHANES]    Frequency of Communication with Friends and Family: More than three times a week    Frequency of Social Gatherings with Friends and Family: More than three times a week    Attends Religious Services: Never    Database administrator or Organizations: No    Attends Banker Meetings: Never    Marital Status:  Widowed  Intimate Partner Violence: Not At Risk (10/24/2022)   Humiliation, Afraid, Rape, and Kick questionnaire    Fear of Current or Ex-Partner: No    Emotionally Abused: No    Physically Abused: No    Sexually Abused: No    Outpatient Medications Prior to Visit  Medication Sig Dispense Refill   alendronate (FOSAMAX) 70 MG tablet Take 1 tablet (70 mg total) by mouth every 7 (seven) days. Take with a full glass of water on an empty stomach. 20 tablet 2   amLODipine (NORVASC) 5 MG tablet Take 1 tablet (5 mg total) by mouth daily. 90 tablet 0   lisinopril (ZESTRIL) 10 MG tablet Take 1 tablet (10 mg total) by mouth daily. 90 tablet 1   rosuvastatin (CRESTOR) 10 MG tablet Take 1 tablet (10 mg  total) by mouth daily. 90 tablet 3   Vitamin D, Ergocalciferol, (DRISDOL) 1.25 MG (50000 UNIT) CAPS capsule Take 1 capsule (50,000 Units total) by mouth every 7 (seven) days. 20 capsule 1   No facility-administered medications prior to visit.    No Known Allergies  ROS Review of Systems  Constitutional:  Negative for chills and fever.  Eyes:  Negative for visual disturbance.  Respiratory:  Negative for chest tightness and shortness of breath.   Genitourinary:  Negative for dysuria, frequency and urgency.  Neurological:  Negative for dizziness and headaches.      Objective:    Physical Exam HENT:     Head: Normocephalic.     Mouth/Throat:     Mouth: Mucous membranes are moist.  Cardiovascular:     Rate and Rhythm: Normal rate.     Heart sounds: Normal heart sounds.  Pulmonary:     Effort: Pulmonary effort is normal.     Breath sounds: Normal breath sounds.  Neurological:     Mental Status: She is alert.     BP 132/75   Pulse 76   Wt 146 lb 1.3 oz (66.3 kg)   SpO2 97%   BMI 26.72 kg/m  Wt Readings from Last 3 Encounters:  12/19/22 146 lb 1.3 oz (66.3 kg)  10/24/22 146 lb (66.2 kg)  09/18/22 147 lb 9.6 oz (67 kg)    Lab Results  Component Value Date   TSH 1.970 08/16/2022   Lab Results  Component Value Date   WBC 8.7 08/16/2022   HGB 13.5 08/16/2022   HCT 40.8 08/16/2022   MCV 92 08/16/2022   PLT 359 08/16/2022   Lab Results  Component Value Date   NA 142 09/18/2022   K 3.9 09/18/2022   CO2 25 09/18/2022   GLUCOSE 96 09/18/2022   BUN 11 09/18/2022   CREATININE 0.67 09/18/2022   BILITOT 1.0 08/16/2022   ALKPHOS 73 08/16/2022   AST 11 08/16/2022   ALT 9 08/16/2022   PROT 6.5 08/16/2022   ALBUMIN 4.2 08/16/2022   CALCIUM 9.9 09/18/2022   ANIONGAP 10 08/14/2022   EGFR 89 09/18/2022   Lab Results  Component Value Date   CHOL 205 (H) 08/16/2022   Lab Results  Component Value Date   HDL 61 08/16/2022   Lab Results  Component Value Date    LDLCALC 124 (H) 08/16/2022   Lab Results  Component Value Date   TRIG 114 08/16/2022   Lab Results  Component Value Date   CHOLHDL 3.4 08/16/2022   Lab Results  Component Value Date   HGBA1C 5.6 08/16/2022      Assessment & Plan:  Primary hypertension  Assessment & Plan: Controlled Encouraged to continue taking lisinopril 10 mg and amlodipine 5 mg daily Asymptomatic Encouraged low sodium diet with increased physical activity BP Readings from Last 3 Encounters:  12/19/22 132/75  09/18/22 116/68  08/21/22 (!) 163/73       HYPERCHOLESTEROLEMIA Assessment & Plan: Encouraged to continue taking rosuvastatin 10 mg daily Recommended decreasing her intake of greasy, fatty, and starchy foods with increase physical activity Lab Results  Component Value Date   CHOL 205 (H) 08/16/2022   HDL 61 08/16/2022   LDLCALC 124 (H) 08/16/2022   TRIG 114 08/16/2022   CHOLHDL 3.4 08/16/2022      Muscle strain Assessment & Plan: Educated the patient on posture correction, advising her to be mindful of her posture throughout the day, maintain a neutral spine, and avoid slouching or hunching forward. Recommended stretching and mobility exercises. Advised applying heat and cold to the affected area and to continue taking Tylenol as needed for pain relief.     Encounter for immunization Assessment & Plan: Patient educated on CDC recommendation for the vaccine. Verbal consent was obtained from the patient, vaccine administered by nurse, no sign of adverse reactions noted at this time. Patient education on arm soreness and use of tylenol or ibuprofen for this patient  was discussed. Patient educated on the signs and symptoms of adverse effect and advise to contact the office if they occur.   Orders: -     Flu Vaccine Trivalent High Dose (Fluad) -     Pneumococcal conjugate vaccine 20-valent  IFG (impaired fasting glucose) -     Hemoglobin A1c  Vitamin D deficiency -     VITAMIN D 25  Hydroxy (Vit-D Deficiency, Fractures)  TSH (thyroid-stimulating hormone deficiency) -     TSH + free T4  Other hyperlipidemia -     Lipid panel -     CMP14+EGFR -     CBC with Differential/Platelet  Note: This chart has been completed using Engineer, civil (consulting) software, and while attempts have been made to ensure accuracy, certain words and phrases may not be transcribed as intended.    The patient is encouraged to follow-up for worsening of her symptoms  Follow-up: Return in about 4 months (around 04/18/2023).   Gilmore Laroche, FNP

## 2022-12-19 NOTE — Patient Instructions (Addendum)
I appreciate the opportunity to provide care to you today!    Follow up:  4 months  Labs: please stop by the lab during the week to get your blood drawn (CBC, CMP, TSH, Lipid profile, HgA1c, Vit D)  Muscle strain from poor posture can be uncomfortable, but there are several effective non-pharmacological interventions that can help alleviate pain, improve posture, and prevent recurrence. Here's a list of strategies you can try:  -Posture Correction: Mindful posture: Be conscious of your posture throughout the day. Try to maintain a neutral spine--ears should be in line with your shoulders, and shoulders should be back and relaxed. Avoid slouching or hunching forward. Stretching and Mobility Exercises: Gentle stretching of the affected muscles can help improve flexibility and reduce muscle tension. Focus on stretches for the upper back, neck, shoulders, and chest, as these areas are often affected by poor posture. Neck stretches: Slowly tilt your head to one side, bringing your ear toward your shoulder. Hold for 15-30 seconds and repeat on the other side. Upper back stretches: Sit or stand tall and reach both arms forward as if you're trying to touch the wall in front of you, rounding your upper back slightly. Chest stretches: Stand tall, clasp your hands behind your back, and straighten your arms as you gently lift your hands upward, opening up your chest. Thoracic mobility exercises: Use a foam roller or perform gentle thoracic rotations to increase the mobility of your upper spine and relieve stiffness.  Strengthening Exercises: Strengthening the muscles that support good posture can help prevent future strains. Focus on exercises that strengthen the core and upper back muscles. Planks: Engage your core and hold a plank position, focusing on keeping your body in a straight line. Scapular retractions: Sit or stand with your back straight, and pinch your shoulder blades together as if trying to  hold a pencil between them. Hold for a few seconds, then relax. Rows: Use resistance bands or light weights to perform rows, targeting the muscles of your upper back. Heat or Cold Therapy: Cold compress (for the first 24-48 hours after the injury) can help reduce inflammation and numb the pain. Heat (after the initial swelling has gone down) can help relax tense muscles and improve blood flow to the affected area. You can use a warm towel, heating pad, or take a warm shower.     Please continue to a heart-healthy diet and increase your physical activities. Try to exercise for at least five days a week.    It was a pleasure to see you and I look forward to continuing to work together on your health and well-being. Please do not hesitate to call the office if you need care or have questions about your care.  In case of emergency, please visit the Emergency Department for urgent care, or contact our clinic at [phone] to schedule an appointment. We're here to help you!   Have a wonderful day and week. With Gratitude, Gilmore Laroche MSN, FNP-BC

## 2022-12-19 NOTE — Assessment & Plan Note (Signed)
Patient educated on CDC recommendation for the vaccine. Verbal consent was obtained from the patient, vaccine administered by nurse, no sign of adverse reactions noted at this time. Patient education on arm soreness and use of tylenol or ibuprofen for this patient  was discussed. Patient educated on the signs and symptoms of adverse effect and advise to contact the office if they occur.  

## 2022-12-19 NOTE — Assessment & Plan Note (Addendum)
Controlled Encouraged to continue taking lisinopril 10 mg and amlodipine 5 mg daily Asymptomatic Encouraged low sodium diet with increased physical activity BP Readings from Last 3 Encounters:  12/19/22 132/75  09/18/22 116/68  08/21/22 (!) 163/73

## 2022-12-19 NOTE — Assessment & Plan Note (Signed)
Encouraged to continue taking rosuvastatin 10 mg daily Recommended decreasing her intake of greasy, fatty, and starchy foods with increase physical activity Lab Results  Component Value Date   CHOL 205 (H) 08/16/2022   HDL 61 08/16/2022   LDLCALC 124 (H) 08/16/2022   TRIG 114 08/16/2022   CHOLHDL 3.4 08/16/2022

## 2022-12-19 NOTE — Assessment & Plan Note (Signed)
Educated the patient on posture correction, advising her to be mindful of her posture throughout the day, maintain a neutral spine, and avoid slouching or hunching forward. Recommended stretching and mobility exercises. Advised applying heat and cold to the affected area and to continue taking Tylenol as needed for pain relief.

## 2022-12-25 DIAGNOSIS — E7849 Other hyperlipidemia: Secondary | ICD-10-CM | POA: Diagnosis not present

## 2022-12-25 DIAGNOSIS — E559 Vitamin D deficiency, unspecified: Secondary | ICD-10-CM | POA: Diagnosis not present

## 2022-12-25 DIAGNOSIS — R7301 Impaired fasting glucose: Secondary | ICD-10-CM | POA: Diagnosis not present

## 2022-12-25 DIAGNOSIS — E038 Other specified hypothyroidism: Secondary | ICD-10-CM | POA: Diagnosis not present

## 2022-12-26 LAB — LIPID PANEL
Chol/HDL Ratio: 2.4 ratio (ref 0.0–4.4)
Cholesterol, Total: 154 mg/dL (ref 100–199)
HDL: 65 mg/dL (ref >39)
LDL Chol Calc (NIH): 67 mg/dL (ref 0–99)
Triglycerides: 125 mg/dL (ref 0–149)
VLDL Cholesterol Cal: 22 mg/dL (ref 5–40)

## 2022-12-26 LAB — CMP14+EGFR
ALT: 8 [IU]/L (ref 0–32)
AST: 13 [IU]/L (ref 0–40)
Albumin: 4.6 g/dL (ref 3.8–4.8)
Alkaline Phosphatase: 63 [IU]/L (ref 44–121)
BUN/Creatinine Ratio: 12 (ref 12–28)
BUN: 8 mg/dL (ref 8–27)
Bilirubin Total: 0.8 mg/dL (ref 0.0–1.2)
CO2: 23 mmol/L (ref 20–29)
Calcium: 9.8 mg/dL (ref 8.7–10.3)
Chloride: 102 mmol/L (ref 96–106)
Creatinine, Ser: 0.66 mg/dL (ref 0.57–1.00)
Globulin, Total: 2.5 g/dL (ref 1.5–4.5)
Glucose: 101 mg/dL — ABNORMAL HIGH (ref 70–99)
Potassium: 4.2 mmol/L (ref 3.5–5.2)
Sodium: 139 mmol/L (ref 134–144)
Total Protein: 7.1 g/dL (ref 6.0–8.5)
eGFR: 89 mL/min/{1.73_m2} (ref >59)

## 2022-12-26 LAB — HEMOGLOBIN A1C
Est. average glucose Bld gHb Est-mCnc: 126 mg/dL
Hgb A1c MFr Bld: 6 % — ABNORMAL HIGH (ref 4.8–5.6)

## 2022-12-26 LAB — TSH+FREE T4
Free T4: 1.13 ng/dL (ref 0.82–1.77)
TSH: 1.28 u[IU]/mL (ref 0.450–4.500)

## 2022-12-26 LAB — CBC WITH DIFFERENTIAL/PLATELET
Basophils Absolute: 0.1 10*3/uL (ref 0.0–0.2)
Basos: 1 %
EOS (ABSOLUTE): 0.4 10*3/uL (ref 0.0–0.4)
Eos: 4 %
Hematocrit: 39.6 % (ref 34.0–46.6)
Hemoglobin: 12.6 g/dL (ref 11.1–15.9)
Immature Grans (Abs): 0 10*3/uL (ref 0.0–0.1)
Immature Granulocytes: 0 %
Lymphocytes Absolute: 2.7 10*3/uL (ref 0.7–3.1)
Lymphs: 30 %
MCH: 29.5 pg (ref 26.6–33.0)
MCHC: 31.8 g/dL (ref 31.5–35.7)
MCV: 93 fL (ref 79–97)
Monocytes Absolute: 0.4 10*3/uL (ref 0.1–0.9)
Monocytes: 5 %
Neutrophils Absolute: 5.6 10*3/uL (ref 1.4–7.0)
Neutrophils: 60 %
Platelets: 343 10*3/uL (ref 150–450)
RBC: 4.27 x10E6/uL (ref 3.77–5.28)
RDW: 13 % (ref 11.7–15.4)
WBC: 9.3 10*3/uL (ref 3.4–10.8)

## 2022-12-26 LAB — VITAMIN D 25 HYDROXY (VIT D DEFICIENCY, FRACTURES): Vit D, 25-Hydroxy: 55.6 ng/mL (ref 30.0–100.0)

## 2023-02-04 ENCOUNTER — Other Ambulatory Visit: Payer: Self-pay | Admitting: Family Medicine

## 2023-02-08 ENCOUNTER — Telehealth: Payer: Self-pay | Admitting: Family Medicine

## 2023-02-08 ENCOUNTER — Other Ambulatory Visit: Payer: Self-pay

## 2023-02-08 MED ORDER — LISINOPRIL 10 MG PO TABS: 10.0000 mg | ORAL_TABLET | Freq: Every day | ORAL | 1 refills | Status: DC

## 2023-02-08 NOTE — Telephone Encounter (Signed)
 Prescription Request  02/08/2023  LOV: 12/19/2022  What is the name of the medication or equipment? lisinopril  (ZESTRIL ) 10 MG tablet [[id number]]   Have you contacted your pharmacy to request a refill? Yes   Which pharmacy would you like this sent to?  [id number] [place], [place] - [address] AT [facility] [address] [place] [address] Phone: [phone] Fax: [phone]    Patient notified that their request is being sent to the clinical staff for review and that they should receive a response within 2 business days.   Please advise at  walked into office

## 2023-02-08 NOTE — Telephone Encounter (Signed)
 Refill has been sent.

## 2023-04-23 ENCOUNTER — Ambulatory Visit: Payer: Medicare PPO | Admitting: Family Medicine

## 2023-05-21 ENCOUNTER — Telehealth: Payer: Self-pay | Admitting: Family Medicine

## 2023-05-21 NOTE — Telephone Encounter (Signed)
 Patient needing refill on amLODipine (NORVASC) 5 MG tablet [[id number]] [facility]. Please advise Thank you

## 2023-05-22 ENCOUNTER — Other Ambulatory Visit: Payer: Self-pay

## 2023-05-22 MED ORDER — AMLODIPINE BESYLATE 5 MG PO TABS: 5.0000 mg | ORAL_TABLET | Freq: Every day | ORAL | 0 refills | Status: DC

## 2023-05-22 NOTE — Telephone Encounter (Signed)
 Refill sent to [facility]. LVM informing pt

## 2023-05-30 ENCOUNTER — Ambulatory Visit: Payer: Medicare PPO | Admitting: Family Medicine

## 2023-05-30 ENCOUNTER — Encounter: Payer: Self-pay | Admitting: Family Medicine

## 2023-05-30 VITALS — BP 121/70 | HR 72 | Resp 16 | Ht 64.0 in | Wt 144.1 lb

## 2023-05-30 DIAGNOSIS — I10 Essential (primary) hypertension: Secondary | ICD-10-CM

## 2023-05-30 DIAGNOSIS — M810 Age-related osteoporosis without current pathological fracture: Secondary | ICD-10-CM

## 2023-05-30 DIAGNOSIS — R7301 Impaired fasting glucose: Secondary | ICD-10-CM

## 2023-05-30 DIAGNOSIS — E559 Vitamin D deficiency, unspecified: Secondary | ICD-10-CM | POA: Diagnosis not present

## 2023-05-30 DIAGNOSIS — E7800 Pure hypercholesterolemia, unspecified: Secondary | ICD-10-CM

## 2023-05-30 DIAGNOSIS — E038 Other specified hypothyroidism: Secondary | ICD-10-CM

## 2023-05-30 MED ORDER — ROSUVASTATIN CALCIUM 10 MG PO TABS: 10.0000 mg | ORAL_TABLET | Freq: Every day | ORAL | 3 refills | Status: DC

## 2023-05-30 MED ORDER — AMLODIPINE BESYLATE 5 MG PO TABS
5.0000 mg | ORAL_TABLET | Freq: Every day | ORAL | 2 refills | Status: DC
Start: 2023-05-30 — End: 2023-08-16

## 2023-05-30 MED ORDER — VITAMIN D (ERGOCALCIFEROL) 1.25 MG (50000 UNIT) PO CAPS
50000.0000 [IU] | ORAL_CAPSULE | ORAL | 1 refills | Status: DC
Start: 2023-05-30 — End: 2023-10-10

## 2023-05-30 MED ORDER — ALENDRONATE SODIUM 70 MG PO TABS: 70.0000 mg | ORAL_TABLET | ORAL | 2 refills | Status: DC

## 2023-05-30 MED ORDER — LISINOPRIL 10 MG PO TABS: 10.0000 mg | ORAL_TABLET | Freq: Every day | ORAL | 2 refills | Status: DC

## 2023-05-30 NOTE — Patient Instructions (Addendum)
 I appreciate the opportunity to provide care to you today!    Follow up:  4 months  Fasting Labs: please stop by the lab during the week to get your blood drawn (CBC, CMP, TSH, Lipid profile, HgA1c, Vit D)   Attached with your AVS, you will find valuable resources for self-education. I highly recommend dedicating some time to thoroughly examine them.   Please continue to a heart-healthy diet and increase your physical activities. Try to exercise for at least five days a week.    It was a pleasure to see you and I look forward to continuing to work together on your health and well-being. Please do not hesitate to call the office if you need care or have questions about your care.  In case of emergency, please visit the Emergency Department for urgent care, or contact our clinic at [phone] to schedule an appointment. We're here to help you!   Have a wonderful day and week. With Gratitude, Gilmore Laroche MSN, FNP-BC

## 2023-05-30 NOTE — Assessment & Plan Note (Signed)
 Encouraged to continue taking rosuvastatin  10 mg daily Recommended decreasing her intake of greasy, fatty, and starchy foods with increase physical activity Lab Results  Component Value Date   CHOL 154 12/25/2022   HDL 65 12/25/2022   LDLCALC 67 12/25/2022   TRIG 125 12/25/2022   CHOLHDL 2.4 12/25/2022

## 2023-05-30 NOTE — Progress Notes (Signed)
 Established Patient Office Visit  Subjective:  Patient ID: Charlotte Rich, female    DOB: [birth year]  Age: 83 y.o. MRN: [id number]  CC:  Chief Complaint  Patient presents with   Hypertension    4 month follow up     HPI Charlotte Rich is a 83 y.o. female with past medical history of hypertension, and hyperlipidemia, presents for f/u of  chronic medical conditions. For the details of today's visit, please refer to the assessment and plan.     Past Medical History:  Diagnosis Date   Hypertension     History reviewed. No pertinent surgical history.  History reviewed. No pertinent family history.  Social History   Socioeconomic History   Marital status: Married    Spouse name: Not on file   Number of children: Not on file   Years of education: Not on file   Highest education level: Not on file  Occupational History   Not on file  Tobacco Use   Smoking status: Every Day    Types: Cigarettes   Smokeless tobacco: Never   Tobacco comments:    8 cig a day  Substance and Sexual Activity   Alcohol use: Yes    Comment: occ   Drug use: Not Currently   Sexual activity: Not Currently  Other Topics Concern   Not on file  Social History Narrative   Not on file   Social Drivers of Health   Financial Resource Strain: Low Risk  (10/24/2022)   Overall Financial Resource Strain (CARDIA)    Difficulty of Paying Living Expenses: Not hard at all  Food Insecurity: No Food Insecurity (10/24/2022)   Hunger Vital Sign    Worried About Running Out of Food in the Last Year: Never true    Ran Out of Food in the Last Year: Never true  Transportation Needs: No Transportation Needs (10/24/2022)   PRAPARE - Administrator, Civil Service (Medical): No    Lack of Transportation (Non-Medical): No  Physical Activity: Inactive (10/24/2022)   Exercise Vital Sign    Days of Exercise per Week: 0 days    Minutes of Exercise per Session: 0 min  Stress: No Stress Concern Present  (10/24/2022)   [employer] of Occupational Health - Occupational Stress Questionnaire    Feeling of Stress : Not at all  Social Connections: Socially Isolated (10/24/2022)   Social Connection and Isolation Panel [NHANES]    Frequency of Communication with Friends and Family: More than three times a week    Frequency of Social Gatherings with Friends and Family: More than three times a week    Attends Religious Services: Never    Database administrator or Organizations: No    Attends Banker Meetings: Never    Marital Status: Widowed  Intimate Partner Violence: Not At Risk (10/24/2022)   Humiliation, Afraid, Rape, and Kick questionnaire    Fear of Current or Ex-Partner: No    Emotionally Abused: No    Physically Abused: No    Sexually Abused: No    Outpatient Medications Prior to Visit  Medication Sig Dispense Refill   alendronate  (FOSAMAX ) 70 MG tablet Take 1 tablet (70 mg total) by mouth every 7 (seven) days. Take with a full glass of water on an empty stomach. 20 tablet 2   amLODipine  (NORVASC ) 5 MG tablet Take 1 tablet (5 mg total) by mouth daily. 90 tablet 0   lisinopril  (ZESTRIL ) 10 MG tablet Take 1  tablet (10 mg total) by mouth daily. 90 tablet 1   rosuvastatin  (CRESTOR ) 10 MG tablet Take 1 tablet (10 mg total) by mouth daily. 90 tablet 3   Vitamin D , Ergocalciferol , (DRISDOL ) 1.25 MG (50000 UNIT) CAPS capsule Take 1 capsule (50,000 Units total) by mouth every 7 (seven) days. 20 capsule 1   No facility-administered medications prior to visit.    No Known Allergies  ROS Review of Systems  Constitutional:  Negative for chills and fever.  Eyes:  Negative for visual disturbance.  Respiratory:  Negative for chest tightness and shortness of breath.   Neurological:  Negative for dizziness and headaches.      Objective:    Physical Exam HENT:     Head: Normocephalic.     Mouth/Throat:     Mouth: Mucous membranes are moist.  Cardiovascular:     Rate and  Rhythm: Normal rate.     Heart sounds: Normal heart sounds.  Pulmonary:     Effort: Pulmonary effort is normal.     Breath sounds: Normal breath sounds.  Neurological:     Mental Status: She is alert.     BP 121/70   Pulse 72   Resp 16   Ht 5\' 4"  (1.626 m)   Wt 144 lb 1.9 oz (65.4 kg)   SpO2 95%   BMI 24.74 kg/m  Wt Readings from Last 3 Encounters:  05/30/23 144 lb 1.9 oz (65.4 kg)  12/19/22 146 lb 1.3 oz (66.3 kg)  10/24/22 146 lb (66.2 kg)    Lab Results  Component Value Date   TSH 1.280 12/25/2022   Lab Results  Component Value Date   WBC 9.3 12/25/2022   HGB 12.6 12/25/2022   HCT 39.6 12/25/2022   MCV 93 12/25/2022   PLT 343 12/25/2022   Lab Results  Component Value Date   NA 139 12/25/2022   K 4.2 12/25/2022   CO2 23 12/25/2022   GLUCOSE 101 (H) 12/25/2022   BUN 8 12/25/2022   CREATININE 0.66 12/25/2022   BILITOT 0.8 12/25/2022   ALKPHOS 63 12/25/2022   AST 13 12/25/2022   ALT 8 12/25/2022   PROT 7.1 12/25/2022   ALBUMIN 4.6 12/25/2022   CALCIUM  9.8 12/25/2022   ANIONGAP 10 08/14/2022   EGFR 89 12/25/2022   Lab Results  Component Value Date   CHOL 154 12/25/2022   Lab Results  Component Value Date   HDL 65 12/25/2022   Lab Results  Component Value Date   LDLCALC 67 12/25/2022   Lab Results  Component Value Date   TRIG 125 12/25/2022   Lab Results  Component Value Date   CHOLHDL 2.4 12/25/2022   Lab Results  Component Value Date   HGBA1C 6.0 (H) 12/25/2022      Assessment & Plan:  Primary hypertension Assessment & Plan: Controlled Encouraged to continue taking lisinopril  10 mg and amlodipine  5 mg daily Asymptomatic Encouraged low sodium diet with increased physical activity BP Readings from Last 3 Encounters:  05/30/23 121/70  12/19/22 132/75  09/18/22 116/68      Orders: -     Lisinopril ; Take 1 tablet (10 mg total) by mouth daily.  Dispense: 90 tablet; Refill: 2 -     amLODIPine  Besylate; Take 1 tablet (5 mg total)  by mouth daily.  Dispense: 90 tablet; Refill: 2  HYPERCHOLESTEROLEMIA Assessment & Plan: Encouraged to continue taking rosuvastatin  10 mg daily Recommended decreasing her intake of greasy, fatty, and starchy foods with increase physical activity  Lab Results  Component Value Date   CHOL 154 12/25/2022   HDL 65 12/25/2022   LDLCALC 67 12/25/2022   TRIG 125 12/25/2022   CHOLHDL 2.4 12/25/2022     Orders: -     Lipid panel -     CMP14+EGFR -     CBC with Differential/Platelet -     Rosuvastatin  Calcium ; Take 1 tablet (10 mg total) by mouth daily.  Dispense: 90 tablet; Refill: 3  Age-related osteoporosis without current pathological fracture -     Alendronate  Sodium; Take 1 tablet (70 mg total) by mouth every 7 (seven) days. Take with a full glass of water on an empty stomach.  Dispense: 20 tablet; Refill: 2  IFG (impaired fasting glucose) -     Hemoglobin A1c  Vitamin D  deficiency -     VITAMIN D  25 Hydroxy (Vit-D Deficiency, Fractures) -     Vitamin D  (Ergocalciferol ); Take 1 capsule (50,000 Units total) by mouth every 7 (seven) days.  Dispense: 20 capsule; Refill: 1  TSH (thyroid -stimulating hormone deficiency) -     TSH + free T4   Note: This chart has been completed using Engineer, civil (consulting) software, and while attempts have been made to ensure accuracy, certain words and phrases may not be transcribed as intended.   Follow-up: Return in about 4 months (around 09/29/2023).   Charlotte Harold, FNP

## 2023-05-30 NOTE — Assessment & Plan Note (Signed)
 Controlled Encouraged to continue taking lisinopril  10 mg and amlodipine  5 mg daily Asymptomatic Encouraged low sodium diet with increased physical activity BP Readings from Last 3 Encounters:  05/30/23 121/70  12/19/22 132/75  09/18/22 116/68

## 2023-05-31 DIAGNOSIS — E038 Other specified hypothyroidism: Secondary | ICD-10-CM | POA: Diagnosis not present

## 2023-05-31 DIAGNOSIS — E7849 Other hyperlipidemia: Secondary | ICD-10-CM | POA: Diagnosis not present

## 2023-05-31 DIAGNOSIS — E559 Vitamin D deficiency, unspecified: Secondary | ICD-10-CM | POA: Diagnosis not present

## 2023-05-31 DIAGNOSIS — R7301 Impaired fasting glucose: Secondary | ICD-10-CM | POA: Diagnosis not present

## 2023-06-01 LAB — CBC WITH DIFFERENTIAL/PLATELET
Basophils Absolute: 0.1 10*3/uL (ref 0.0–0.2)
Basos: 1 %
EOS (ABSOLUTE): 0.3 10*3/uL (ref 0.0–0.4)
Eos: 3 %
Hematocrit: 39.9 % (ref 34.0–46.6)
Hemoglobin: 13 g/dL (ref 11.1–15.9)
Immature Grans (Abs): 0 10*3/uL (ref 0.0–0.1)
Immature Granulocytes: 0 %
Lymphocytes Absolute: 2.4 10*3/uL (ref 0.7–3.1)
Lymphs: 25 %
MCH: 29.5 pg (ref 26.6–33.0)
MCHC: 32.6 g/dL (ref 31.5–35.7)
MCV: 91 fL (ref 79–97)
Monocytes Absolute: 0.5 10*3/uL (ref 0.1–0.9)
Monocytes: 5 %
Neutrophils Absolute: 6.4 10*3/uL (ref 1.4–7.0)
Neutrophils: 66 %
Platelets: 372 10*3/uL (ref 150–450)
RBC: 4.4 x10E6/uL (ref 3.77–5.28)
RDW: 12.7 % (ref 11.7–15.4)
WBC: 9.7 10*3/uL (ref 3.4–10.8)

## 2023-06-01 LAB — CMP14+EGFR
ALT: 8 IU/L (ref 0–32)
AST: 13 IU/L (ref 0–40)
Albumin: 4.6 g/dL (ref 3.8–4.8)
Alkaline Phosphatase: 67 IU/L (ref 44–121)
BUN/Creatinine Ratio: 14 (ref 12–28)
BUN: 10 mg/dL (ref 8–27)
Bilirubin Total: 0.9 mg/dL (ref 0.0–1.2)
CO2: 21 mmol/L (ref 20–29)
Calcium: 9.5 mg/dL (ref 8.7–10.3)
Chloride: 102 mmol/L (ref 96–106)
Creatinine, Ser: 0.7 mg/dL (ref 0.57–1.00)
Globulin, Total: 2.4 g/dL (ref 1.5–4.5)
Glucose: 88 mg/dL (ref 70–99)
Potassium: 4.3 mmol/L (ref 3.5–5.2)
Sodium: 140 mmol/L (ref 134–144)
Total Protein: 7 g/dL (ref 6.0–8.5)
eGFR: 87 mL/min/{1.73_m2} (ref >59)

## 2023-06-01 LAB — LIPID PANEL
Chol/HDL Ratio: 2.1 ratio (ref 0.0–4.4)
Cholesterol, Total: 147 mg/dL (ref 100–199)
HDL: 70 mg/dL (ref >39)
LDL Chol Calc (NIH): 59 mg/dL (ref 0–99)
Triglycerides: 98 mg/dL (ref 0–149)
VLDL Cholesterol Cal: 18 mg/dL (ref 5–40)

## 2023-06-01 LAB — HEMOGLOBIN A1C
Est. average glucose Bld gHb Est-mCnc: 120 mg/dL
Hgb A1c MFr Bld: 5.8 % — ABNORMAL HIGH (ref 4.8–5.6)

## 2023-06-01 LAB — VITAMIN D 25 HYDROXY (VIT D DEFICIENCY, FRACTURES): Vit D, 25-Hydroxy: 58.8 ng/mL (ref 30.0–100.0)

## 2023-06-01 LAB — TSH+FREE T4
Free T4: 1.06 ng/dL (ref 0.82–1.77)
TSH: 1.8 u[IU]/mL (ref 0.450–4.500)

## 2023-06-04 NOTE — Progress Notes (Signed)
 Please inform the patient that her labs indicate she is prediabetic. I recommend decreasing her intake of high-sugar foods and beverages and increasing her physical activity. All other labs are stable.

## 2023-08-15 ENCOUNTER — Telehealth: Payer: Self-pay | Admitting: Family Medicine

## 2023-08-15 NOTE — Telephone Encounter (Signed)
 Prescription Request  08/15/2023  LOV: 05/30/2023  What is the name of the medication or equipment? amLODipine  (NORVASC ) 5 MG tablet [[id number]]   Vitamin D , Ergocalciferol , (DRISDOL ) 1.25 MG (50000 UNIT) CAPS capsule [[id number]]   lisinopril  (ZESTRIL ) 10 MG tablet [[id number]]   Have you contacted your pharmacy to request a refill? Yes   Which pharmacy would you like this sent to?  [id number] [place], [place] - [address] AT [facility] [address] [place]  [address] Phone: [phone] Fax: [phone]    Patient notified that their request is being sent to the clinical staff for review and that they should receive a response within 2 business days.   Please advise at patient came into office

## 2023-08-15 NOTE — Telephone Encounter (Signed)
Kindly refill

## 2023-08-16 ENCOUNTER — Other Ambulatory Visit: Payer: Self-pay

## 2023-08-16 DIAGNOSIS — I10 Essential (primary) hypertension: Secondary | ICD-10-CM

## 2023-08-16 MED ORDER — AMLODIPINE BESYLATE 5 MG PO TABS: 5.0000 mg | ORAL_TABLET | Freq: Every day | ORAL | 2 refills | Status: DC

## 2023-08-16 NOTE — Telephone Encounter (Signed)
Amlodipine is refilled.

## 2023-10-10 ENCOUNTER — Ambulatory Visit: Payer: Self-pay | Admitting: Family Medicine

## 2023-10-10 VITALS — BP 127/66 | HR 73 | Ht 64.0 in | Wt 143.1 lb

## 2023-10-10 DIAGNOSIS — E559 Vitamin D deficiency, unspecified: Secondary | ICD-10-CM

## 2023-10-10 DIAGNOSIS — R7301 Impaired fasting glucose: Secondary | ICD-10-CM

## 2023-10-10 DIAGNOSIS — I10 Essential (primary) hypertension: Secondary | ICD-10-CM | POA: Diagnosis not present

## 2023-10-10 DIAGNOSIS — E7849 Other hyperlipidemia: Secondary | ICD-10-CM | POA: Diagnosis not present

## 2023-10-10 DIAGNOSIS — E7800 Pure hypercholesterolemia, unspecified: Secondary | ICD-10-CM | POA: Diagnosis not present

## 2023-10-10 DIAGNOSIS — Z23 Encounter for immunization: Secondary | ICD-10-CM | POA: Diagnosis not present

## 2023-10-10 DIAGNOSIS — E038 Other specified hypothyroidism: Secondary | ICD-10-CM

## 2023-10-10 MED ORDER — VITAMIN D (ERGOCALCIFEROL) 1.25 MG (50000 UNIT) PO CAPS: 50000.0000 [IU] | ORAL_CAPSULE | ORAL | 1 refills | Status: DC

## 2023-10-10 NOTE — Patient Instructions (Addendum)
 I appreciate the opportunity to provide care to you today!    Follow up:  5 months  Labs: please stop by the lab today to get your blood drawn (CBC, CMP, TSH, Lipid profile, HgA1c, Vit D)  Schedule for medicare annual wellness visit  For a Healthier YOU, I Recommend: Reducing your intake of sugar, sodium, carbohydrates, and saturated fats. Increasing your fiber intake by incorporating more whole grains, fruits, and vegetables into your meals. Setting healthy goals with a focus on lowering your consumption of carbs, sugar, and unhealthy fats. Adding variety to your diet by including a wide range of fruits and vegetables. Cutting back on soda and limiting processed foods as much as possible. Staying active: In addition to taking your weight loss medication, aim for at least 150 minutes of moderate-intensity physical activity each week for optimal results.   Please follow up if your symptoms worsen or fail to improve.     Please continue to a heart-healthy diet and increase your physical activities. Try to exercise for at least five days a week.    It was a pleasure to see you and I look forward to continuing to work together on your health and well-being. Please do not hesitate to call the office if you need care or have questions about your care.  In case of emergency, please visit the Emergency Department for urgent care, or contact our clinic at [phone] to schedule an appointment. We're here to help you!   Have a wonderful day and week. With Gratitude, Yamilett Anastos MSN, FNP-BC

## 2023-10-10 NOTE — Assessment & Plan Note (Signed)
 Controlled Encouraged to continue taking lisinopril  10 mg and amlodipine  5 mg daily Asymptomatic Encouraged low sodium diet with increased physical activity BP Readings from Last 3 Encounters:  10/10/23 127/66  05/30/23 121/70  12/19/22 132/75

## 2023-10-10 NOTE — Assessment & Plan Note (Signed)
 Patient educated on CDC recommendation for the vaccine. Verbal consent was obtained from the patient, vaccine administered by nurse, no sign of adverse reactions noted at this time. Patient education on arm soreness and use of tylenol or ibuprofen for this patient  was discussed. Patient educated on the signs and symptoms of adverse effect and advise to contact the office if they occur.

## 2023-10-10 NOTE — Assessment & Plan Note (Signed)
 Encouraged to increase his intake of vitamin D-rich foods such as fatty fish (e.g., salmon, mackerel, and sardines), fortified dairy products, egg yolks, and fortified cereals.

## 2023-10-10 NOTE — Progress Notes (Signed)
 Established Patient Office Visit  Subjective:  Patient ID: Charlotte Rich, female    DOB: 04/19/1940  Age: 83 y.o. MRN: [id number]  CC:  Chief Complaint  Patient presents with   Follow-up    Hypertension     HPI Charlotte Rich is a 83 y.o. female with past medical history of hypertension, hyperlipidemia and vit d def presents for f/u of  chronic medical conditions. For the details of today's visit, please refer to the assessment and plan.     Past Medical History:  Diagnosis Date   Hypertension     No past surgical history on file.  No family history on file.  Social History   Socioeconomic History   Marital status: Married    Spouse name: Not on file   Number of children: Not on file   Years of education: Not on file   Highest education level: Not on file  Occupational History   Not on file  Tobacco Use   Smoking status: Every Day    Types: Cigarettes   Smokeless tobacco: Never   Tobacco comments:    8 cig a day  Substance and Sexual Activity   Alcohol use: Yes    Comment: occ   Drug use: Not Currently   Sexual activity: Not Currently  Other Topics Concern   Not on file  Social History Narrative   Not on file   Social Drivers of Health   Financial Resource Strain: Low Risk  (10/24/2022)   Overall Financial Resource Strain (CARDIA)    Difficulty of Paying Living Expenses: Not hard at all  Food Insecurity: No Food Insecurity (10/24/2022)   Hunger Vital Sign    Worried About Running Out of Food in the Last Year: Never true    Ran Out of Food in the Last Year: Never true  Transportation Needs: No Transportation Needs (10/24/2022)   PRAPARE - Administrator, Civil Service (Medical): No    Lack of Transportation (Non-Medical): No  Physical Activity: Inactive (10/24/2022)   Exercise Vital Sign    Days of Exercise per Week: 0 days    Minutes of Exercise per Session: 0 min  Stress: No Stress Concern Present (10/24/2022)   [employer] of  Occupational Health - Occupational Stress Questionnaire    Feeling of Stress : Not at all  Social Connections: Socially Isolated (10/24/2022)   Social Connection and Isolation Panel    Frequency of Communication with Friends and Family: More than three times a week    Frequency of Social Gatherings with Friends and Family: More than three times a week    Attends Religious Services: Never    Database administrator or Organizations: No    Attends Banker Meetings: Never    Marital Status: Widowed  Intimate Partner Violence: Not At Risk (10/24/2022)   Humiliation, Afraid, Rape, and Kick questionnaire    Fear of Current or Ex-Partner: No    Emotionally Abused: No    Physically Abused: No    Sexually Abused: No    Outpatient Medications Prior to Visit  Medication Sig Dispense Refill   alendronate  (FOSAMAX ) 70 MG tablet Take 1 tablet (70 mg total) by mouth every 7 (seven) days. Take with a full glass of water on an empty stomach. 20 tablet 2   amLODipine  (NORVASC ) 5 MG tablet Take 1 tablet (5 mg total) by mouth daily. 90 tablet 2   lisinopril  (ZESTRIL ) 10 MG tablet Take 1 tablet (10  mg total) by mouth daily. 90 tablet 2   rosuvastatin  (CRESTOR ) 10 MG tablet Take 1 tablet (10 mg total) by mouth daily. 90 tablet 3   Vitamin D , Ergocalciferol , (DRISDOL ) 1.25 MG (50000 UNIT) CAPS capsule Take 1 capsule (50,000 Units total) by mouth every 7 (seven) days. 20 capsule 1   No facility-administered medications prior to visit.    No Known Allergies  ROS Review of Systems  Constitutional:  Negative for chills and fever.  Eyes:  Negative for visual disturbance.  Respiratory:  Negative for chest tightness and shortness of breath.   Neurological:  Negative for dizziness and headaches.      Objective:    Physical Exam HENT:     Head: Normocephalic.     Mouth/Throat:     Mouth: Mucous membranes are moist.  Cardiovascular:     Rate and Rhythm: Normal rate.     Heart sounds: Normal  heart sounds.  Pulmonary:     Effort: Pulmonary effort is normal.     Breath sounds: Normal breath sounds.  Neurological:     Mental Status: She is alert.     BP 127/66 (BP Location: Left Arm, Patient Position: Sitting, Cuff Size: Large)   Pulse 73   Ht 5' 4 (1.626 m)   Wt 143 lb 1.9 oz (64.9 kg)   SpO2 93%   BMI 24.57 kg/m  Wt Readings from Last 3 Encounters:  10/10/23 143 lb 1.9 oz (64.9 kg)  05/30/23 144 lb 1.9 oz (65.4 kg)  12/19/22 146 lb 1.3 oz (66.3 kg)    Lab Results  Component Value Date   TSH 1.800 05/31/2023   Lab Results  Component Value Date   WBC 9.7 05/31/2023   HGB 13.0 05/31/2023   HCT 39.9 05/31/2023   MCV 91 05/31/2023   PLT 372 05/31/2023   Lab Results  Component Value Date   NA 140 05/31/2023   K 4.3 05/31/2023   CO2 21 05/31/2023   GLUCOSE 88 05/31/2023   BUN 10 05/31/2023   CREATININE 0.70 05/31/2023   BILITOT 0.9 05/31/2023   ALKPHOS 67 05/31/2023   AST 13 05/31/2023   ALT 8 05/31/2023   PROT 7.0 05/31/2023   ALBUMIN 4.6 05/31/2023   CALCIUM  9.5 05/31/2023   ANIONGAP 10 08/14/2022   EGFR 87 05/31/2023   Lab Results  Component Value Date   CHOL 147 05/31/2023   Lab Results  Component Value Date   HDL 70 05/31/2023   Lab Results  Component Value Date   LDLCALC 59 05/31/2023   Lab Results  Component Value Date   TRIG 98 05/31/2023   Lab Results  Component Value Date   CHOLHDL 2.1 05/31/2023   Lab Results  Component Value Date   HGBA1C 5.8 (H) 05/31/2023      Assessment & Plan:  Primary hypertension Assessment & Plan: Controlled Encouraged to continue taking lisinopril  10 mg and amlodipine  5 mg daily Asymptomatic Encouraged low sodium diet with increased physical activity BP Readings from Last 3 Encounters:  10/10/23 127/66  05/30/23 121/70  12/19/22 132/75       HYPERCHOLESTEROLEMIA Assessment & Plan: Encouraged to continue taking rosuvastatin  10 mg daily Recommended decreasing her intake of greasy,  fatty, and starchy foods with increase physical activity Lab Results  Component Value Date   CHOL 147 05/31/2023   HDL 70 05/31/2023   LDLCALC 59 05/31/2023   TRIG 98 05/31/2023   CHOLHDL 2.1 05/31/2023      Encounter for immunization  Assessment & Plan: Patient educated on CDC recommendation for the vaccine. Verbal consent was obtained from the patient, vaccine administered by nurse, no sign of adverse reactions noted at this time. Patient education on arm soreness and use of tylenol or ibuprofen for this patient  was discussed. Patient educated on the signs and symptoms of adverse effect and advise to contact the office if they occur.   Orders: -     Flu vaccine HIGH DOSE PF(Fluzone Trivalent)  Vitamin D  deficiency Assessment & Plan: Encouraged to increase his intake of vitamin D -rich foods such as fatty fish (e.g., salmon, mackerel, and sardines), fortified dairy products, egg yolks, and fortified cereals.   Orders: -     VITAMIN D  25 Hydroxy (Vit-D Deficiency, Fractures) -     Vitamin D  (Ergocalciferol ); Take 1 capsule (50,000 Units total) by mouth every 7 (seven) days.  Dispense: 27 capsule; Refill: 1  IFG (impaired fasting glucose) -     Hemoglobin A1c  TSH (thyroid -stimulating hormone deficiency) -     TSH + free T4  Other hyperlipidemia -     Lipid panel -     CMP14+EGFR -     CBC with Differential/Platelet  Note: This chart has been completed using Engineer, civil (consulting) software, and while attempts have been made to ensure accuracy, certain words and phrases may not be transcribed as intended.    Follow-up: Return in about 5 months (around 03/11/2024).   Sailor Hevia, FNP

## 2023-10-10 NOTE — Assessment & Plan Note (Signed)
 Encouraged to continue taking rosuvastatin  10 mg daily Recommended decreasing her intake of greasy, fatty, and starchy foods with increase physical activity Lab Results  Component Value Date   CHOL 147 05/31/2023   HDL 70 05/31/2023   LDLCALC 59 05/31/2023   TRIG 98 05/31/2023   CHOLHDL 2.1 05/31/2023

## 2023-10-11 ENCOUNTER — Ambulatory Visit: Payer: Self-pay | Admitting: Family Medicine

## 2023-10-11 LAB — CMP14+EGFR
ALT: 9 IU/L (ref 0–32)
AST: 11 IU/L (ref 0–40)
Albumin: 4.6 g/dL (ref 3.8–4.8)
Alkaline Phosphatase: 61 IU/L (ref 44–121)
BUN/Creatinine Ratio: 14 (ref 12–28)
BUN: 9 mg/dL (ref 8–27)
Bilirubin Total: 0.7 mg/dL (ref 0.0–1.2)
CO2: 24 mmol/L (ref 20–29)
Calcium: 9.7 mg/dL (ref 8.7–10.3)
Chloride: 102 mmol/L (ref 96–106)
Creatinine, Ser: 0.66 mg/dL (ref 0.57–1.00)
Globulin, Total: 2.2 g/dL (ref 1.5–4.5)
Glucose: 89 mg/dL (ref 70–99)
Potassium: 4.2 mmol/L (ref 3.5–5.2)
Sodium: 139 mmol/L (ref 134–144)
Total Protein: 6.8 g/dL (ref 6.0–8.5)
eGFR: 89 mL/min/1.73 (ref >59)

## 2023-10-11 LAB — CBC WITH DIFFERENTIAL/PLATELET
Basophils Absolute: 0.1 x10E3/uL (ref 0.0–0.2)
Basos: 1 %
EOS (ABSOLUTE): 0.3 x10E3/uL (ref 0.0–0.4)
Eos: 3 %
Hematocrit: 38.8 % (ref 34.0–46.6)
Hemoglobin: 12.7 g/dL (ref 11.1–15.9)
Immature Grans (Abs): 0 x10E3/uL (ref 0.0–0.1)
Immature Granulocytes: 0 %
Lymphocytes Absolute: 3.1 x10E3/uL (ref 0.7–3.1)
Lymphs: 30 %
MCH: 30.1 pg (ref 26.6–33.0)
MCHC: 32.7 g/dL (ref 31.5–35.7)
MCV: 92 fL (ref 79–97)
Monocytes Absolute: 0.5 x10E3/uL (ref 0.1–0.9)
Monocytes: 5 %
Neutrophils Absolute: 6.2 x10E3/uL (ref 1.4–7.0)
Neutrophils: 61 %
Platelets: 374 x10E3/uL (ref 150–450)
RBC: 4.22 x10E6/uL (ref 3.77–5.28)
RDW: 13.1 % (ref 11.7–15.4)
WBC: 10.2 x10E3/uL (ref 3.4–10.8)

## 2023-10-11 LAB — LIPID PANEL
Chol/HDL Ratio: 2 ratio (ref 0.0–4.4)
Cholesterol, Total: 129 mg/dL (ref 100–199)
HDL: 63 mg/dL (ref >39)
LDL Chol Calc (NIH): 48 mg/dL (ref 0–99)
Triglycerides: 96 mg/dL (ref 0–149)
VLDL Cholesterol Cal: 18 mg/dL (ref 5–40)

## 2023-10-11 LAB — HEMOGLOBIN A1C
Est. average glucose Bld gHb Est-mCnc: 117 mg/dL
Hgb A1c MFr Bld: 5.7 % — ABNORMAL HIGH (ref 4.8–5.6)

## 2023-10-11 LAB — TSH+FREE T4
Free T4: 1.03 ng/dL (ref 0.82–1.77)
TSH: 1.13 u[IU]/mL (ref 0.450–4.500)

## 2023-10-11 LAB — VITAMIN D 25 HYDROXY (VIT D DEFICIENCY, FRACTURES): Vit D, 25-Hydroxy: 58.2 ng/mL (ref 30.0–100.0)

## 2023-10-11 NOTE — Progress Notes (Signed)
 Please inform the patient that her labs indicate she is prediabetic. I recommend decreasing her intake of high-sugar foods and beverages and increasing physical activity. All other labs are stable.

## 2023-10-29 ENCOUNTER — Ambulatory Visit (INDEPENDENT_AMBULATORY_CARE_PROVIDER_SITE_OTHER)

## 2023-10-29 VITALS — BP 127/66 | Ht 64.0 in | Wt 143.0 lb

## 2023-10-29 DIAGNOSIS — Z1231 Encounter for screening mammogram for malignant neoplasm of breast: Secondary | ICD-10-CM

## 2023-10-29 DIAGNOSIS — Z0000 Encounter for general adult medical examination without abnormal findings: Secondary | ICD-10-CM

## 2023-10-29 NOTE — Progress Notes (Signed)
 Please attest and cosign this visit due to patients primary care provider not being immediately available at the time the visit was completed.   Subjective:   Charlotte Rich is a 83 y.o. who presents for a Medicare Wellness preventive visit. As a reminder, Annual Wellness Visits don't include a physical exam, and some assessments may be limited, especially if this visit is performed virtually. We may recommend an in-person follow-up visit with your provider if needed.  Visit Complete: Virtual I connected with  Charlotte Rich on 10/29/23 by a audio enabled telemedicine application and verified that I am speaking with the correct person using two identifiers.  Patient Location: Home  Provider Location: Home Office  I discussed the limitations of evaluation and management by telemedicine. The patient expressed understanding and agreed to proceed.  Vital Signs: Because this visit was a virtual/telehealth visit, some criteria may be missing or patient reported. Any vitals not documented were not able to be obtained and vitals that have been documented are patient reported. VideoDeclined- This patient declined Librarian, academic. Therefore the visit was completed with audio only.  Persons Participating in Visit: Patient.  AWV Questionnaire: No: Patient Medicare AWV questionnaire was not completed prior to this visit. Cardiac Risk Factors include: advanced age (>61men, >51 women);dyslipidemia;hypertension;smoking/ tobacco exposure;sedentary lifestyle     Objective:    Today's Vitals   10/29/23 1513  BP: 127/66  Weight: 143 lb (64.9 kg)  Height: 5' 4 (1.626 m)   Body mass index is 24.55 kg/m.    10/29/2023    3:13 PM 10/24/2022    2:42 PM 08/14/2022    5:10 PM  Advanced Directives  Does Patient Have a Medical Advance Directive? No Yes Yes  Type of Special educational needs teacher of [place];Living will Healthcare Power of [place];Living will  Copy of  Healthcare Power of Attorney in Chart?  No - copy requested   Would patient like information on creating a medical advance directive? No - Patient declined      Current Medications (verified) Outpatient Encounter Medications as of 10/29/2023  Medication Sig   alendronate  (FOSAMAX ) 70 MG tablet Take 1 tablet (70 mg total) by mouth every 7 (seven) days. Take with a full glass of water on an empty stomach.   amLODipine  (NORVASC ) 5 MG tablet Take 1 tablet (5 mg total) by mouth daily.   lisinopril  (ZESTRIL ) 10 MG tablet Take 1 tablet (10 mg total) by mouth daily.   rosuvastatin  (CRESTOR ) 10 MG tablet Take 1 tablet (10 mg total) by mouth daily.   Vitamin D , Ergocalciferol , (DRISDOL ) 1.25 MG (50000 UNIT) CAPS capsule Take 1 capsule (50,000 Units total) by mouth every 7 (seven) days.   No facility-administered encounter medications on file as of 10/29/2023.    Allergies (verified) Patient has no known allergies.   History: Past Medical History:  Diagnosis Date   Hypertension    History reviewed. No pertinent surgical history. History reviewed. No pertinent family history. Social History   Socioeconomic History   Marital status: Married    Spouse name: Not on file   Number of children: Not on file   Years of education: Not on file   Highest education level: Not on file  Occupational History   Not on file  Tobacco Use   Smoking status: Every Day    Current packs/day: 0.25    Average packs/day: 0.3 packs/day for 59.7 years (14.9 ttl pk-yrs)    Types: Cigarettes  Start date: 1966   Smokeless tobacco: Never   Tobacco comments:    8 cig a day  Substance and Sexual Activity   Alcohol use: Yes    Comment: occ   Drug use: Not Currently   Sexual activity: Not Currently  Other Topics Concern   Not on file  Social History Narrative   Not on file   Social Drivers of Health   Financial Resource Strain: Low Risk  (10/29/2023)   Overall Financial Resource Strain (CARDIA)     Difficulty of Paying Living Expenses: Not hard at all  Food Insecurity: No Food Insecurity (10/29/2023)   Hunger Vital Sign    Worried About Running Out of Food in the Last Year: Never true    Ran Out of Food in the Last Year: Never true  Transportation Needs: No Transportation Needs (10/29/2023)   PRAPARE - Administrator, Civil Service (Medical): No    Lack of Transportation (Non-Medical): No  Physical Activity: Inactive (10/29/2023)   Exercise Vital Sign    Days of Exercise per Week: 0 days    Minutes of Exercise per Session: 0 min  Stress: No Stress Concern Present (10/29/2023)   [employer] of Occupational Health - Occupational Stress Questionnaire    Feeling of Stress: Not at all  Social Connections: Moderately Isolated (10/29/2023)   Social Connection and Isolation Panel    Frequency of Communication with Friends and Family: More than three times a week    Frequency of Social Gatherings with Friends and Family: More than three times a week    Attends Religious Services: Never    Database administrator or Organizations: Yes    Attends Engineer, structural: More than 4 times per year    Marital Status: Widowed    Tobacco Counseling Ready to quit: Yes Counseling given: Yes Tobacco comments: 8 cig a day   Clinical Intake: Pre-visit preparation completed: Yes Pain : No/denies pain   BMI - recorded: 24.55 Nutritional Status: BMI of 19-24  Normal Nutritional Risks: None Diabetes: No Lab Results  Component Value Date   HGBA1C 5.7 (H) 10/10/2023   HGBA1C 5.8 (H) 05/31/2023   HGBA1C 6.0 (H) 12/25/2022    How often do you need to have someone help you when you read instructions, pamphlets, or other written materials from your doctor or pharmacy?: 1 - Never Interpreter Needed?: No Information entered by :: Jeanna Giuffre W CMA (AAMA)  Activities of Daily Living     10/29/2023    3:30 PM  In your present state of health, do you have any difficulty performing  the following activities:  Hearing? 0  Vision? 0  Difficulty concentrating or making decisions? 0  Walking or climbing stairs? 0  Dressing or bathing? 0  Doing errands, shopping? 0  Preparing Food and eating ? N  Using the Toilet? N  In the past six months, have you accidently leaked urine? N  Do you have problems with loss of bowel control? N  Managing your Medications? N  Managing your Finances? N  Housekeeping or managing your Housekeeping? N   Patient Care Team: Zarwolo, Gloria, FNP as PCP - General (Family Medicine)   I have updated your Care Teams any recent Medical Services you may have received from other providers in the past year.     Assessment:   This is a routine wellness examination for Charlotte Rich.  Hearing/Vision screen Hearing Screening - Comments:: Patient denies any hearing difficulties.   Vision  Screening - Comments:: Patient is not up to date on yearly eye exams. She has previously seen Dr. Octavia in [place] and will call to schedule a new appt.   Goals Addressed               This Visit's Progress     Work on diet (pt-stated)        Continue to work on my diet and start eating at home more and avoid salt and sweets.         Depression Screen     10/29/2023    3:18 PM 05/30/2023    1:24 PM 12/19/2022    1:39 PM 10/24/2022    2:41 PM 09/18/2022    1:29 PM 08/21/2022    2:31 PM 08/14/2022    4:06 PM  PHQ 2/9 Scores  PHQ - 2 Score 0 0 0 0 0 0 0  PHQ- 9 Score 0  0 0 0  1     Fall Risk     10/29/2023    3:26 PM 05/30/2023    1:24 PM 12/19/2022    1:39 PM 10/24/2022    2:40 PM 09/18/2022    1:29 PM  Fall Risk   Falls in the past year? 0 0 0 0 0  Number falls in past yr: 0 0 0 0 0  Injury with Fall? 0 0 0 0 0  Risk for fall due to : No Fall Risks  No Fall Risks No Fall Risks No Fall Risks  Follow up Falls evaluation completed;Education provided;Falls prevention discussed Falls evaluation completed Falls evaluation completed Falls prevention  discussed Falls evaluation completed    MEDICARE RISK AT HOME:  Medicare Risk at Home Any stairs in or around the home?: Yes If so, are there any without handrails?: No Home free of loose throw rugs in walkways, pet beds, electrical cords, etc?: Yes Adequate lighting in your home to reduce risk of falls?: Yes Life alert?: No Use of a cane, walker or w/c?: No Grab bars in the bathroom?: Yes Shower chair or bench in shower?: Yes Elevated toilet seat or a handicapped toilet?: Yes  TIMED UP AND GO: Was the test performed?  No  Cognitive Function: 6CIT completed        10/29/2023    3:30 PM 10/24/2022    2:42 PM  6CIT Screen  What Year? 0 points 0 points  What month? 0 points 0 points  What time? 0 points 0 points  Count back from 20 0 points 0 points  Months in reverse 0 points 0 points  Repeat phrase 0 points 0 points  Total Score 0 points 0 points    Immunizations Immunization History  Administered Date(s) Administered   Fluad Trivalent(High Dose 65+) 12/19/2022   INFLUENZA, HIGH DOSE SEASONAL PF 10/10/2023   PNEUMOCOCCAL CONJUGATE-20 12/19/2022   Zoster Recombinant(Shingrix) 09/30/2023   Zoster, Live 01/16/2013    Screening Tests Health Maintenance  Topic Date Due   DTaP/Tdap/Td (1 - Tdap) Never done   Mammogram  07/15/2015   COVID-19 Vaccine (1 - 2024-25 season) Never done   Zoster Vaccines- Shingrix (2 of 2) 11/25/2023   DEXA SCAN  08/16/2024   Medicare Annual Wellness (AWV)  10/28/2024   Pneumococcal Vaccine: 50+ Years  Completed   Influenza Vaccine  Completed   HPV VACCINES  Aged Out   Meningococcal B Vaccine  Aged Out   Hepatitis C Screening  Discontinued    Health Maintenance Health Maintenance Due  Topic  Date Due   DTaP/Tdap/Td (1 - Tdap) Never done   Mammogram  07/15/2015   COVID-19 Vaccine (1 - 2024-25 season) Never done   Health Maintenance Items Addressed: Mammogram ordered  Additional Screening: Vision Screening: Recommended annual  ophthalmology exams for early detection of glaucoma and other disorders of the eye. Would you like a referral to an eye doctor? No    Dental Screening: Recommended annual dental exams for proper oral hygiene  Community Resource Referral / Chronic Care Management: CRR required this visit?  No   CCM required this visit?  No  Plan:   I have personally reviewed and noted the following in the patient's chart:   Medical and social history Use of alcohol, tobacco or illicit drugs  Current medications and supplements including opioid prescriptions. Patient is not currently taking opioid prescriptions. Functional ability and status Nutritional status Physical activity Advanced directives List of other physicians Hospitalizations, surgeries, and ER visits in previous 12 months Vitals Screenings to include cognitive, depression, and falls Referrals and appointments  In addition, I have reviewed and discussed with patient certain preventive protocols, quality metrics, and best practice recommendations. A written personalized care plan for preventive services as well as general preventive health recommendations were provided to patient.   Dyana Magner, CMA   10/29/2023   After Visit Summary: (Mail) Due to this being a telephonic visit, the after visit summary with patients personalized plan was offered to patient via mail   Notes: Nothing significant to report at this time.

## 2023-10-29 NOTE — Patient Instructions (Signed)
 Charlotte Rich,  Thank you for taking the time for your Medicare Wellness Visit. I appreciate your continued commitment to your health goals. Please review the care plan we discussed, and feel free to reach out if I can assist you further.  Medicare recommends these wellness visits once per year to help you and your care team stay ahead of potential health issues. These visits are designed to focus on prevention, allowing your provider to concentrate on managing your acute and chronic conditions during your regular appointments.  Please note that Annual Wellness Visits do not include a physical exam. Some assessments may be limited, especially if the visit was conducted virtually. If needed, we may recommend a separate in-person follow-up with your provider.  Ongoing Care Seeing your primary care provider every 3 to 6 months helps us  monitor your health and provide consistent, personalized care.   Referrals If a referral was made during today's visit and you haven't received any updates within two weeks, please contact the referred provider directly to check on the status.  Breast Cancer Screening/Mammogram @ [facility] Call [phone] No perfumes, lotions, or deodorants the day of your screening. You can schedule your mammogram through mychart!  Recommended Screenings:  Health Maintenance  Topic Date Due   DTaP/Tdap/Td vaccine (1 - Tdap) Never done   Breast Cancer Screening  07/15/2015   COVID-19 Vaccine (1 - 2024-25 season) Never done   Zoster (Shingles) Vaccine (2 of 2) 11/25/2023   DEXA scan (bone density measurement)  08/16/2024   Medicare Annual Wellness Visit  10/28/2024   Pneumococcal Vaccine for age over 74  Completed   Flu Shot  Completed   HPV Vaccine  Aged Out   Meningitis B Vaccine  Aged Out   Hepatitis C Screening  Discontinued       10/29/2023    3:13 PM  Advanced Directives  Does Patient Have a Medical Advance Directive? No  Would patient like information on  creating a medical advance directive? No - Patient declined   Advance Care Planning is important because it: Ensures you receive medical care that aligns with your values, goals, and preferences. Provides guidance to your family and loved ones, reducing the emotional burden of decision-making during critical moments.  Vision: Annual vision screenings are recommended for early detection of glaucoma, cataracts, and diabetic retinopathy. These exams can also reveal signs of chronic conditions such as diabetes and high blood pressure.  Dental: Annual dental screenings help detect early signs of oral cancer, gum disease, and other conditions linked to overall health, including heart disease and diabetes.  Please see the attached documents for additional preventive care recommendations.

## 2024-03-11 ENCOUNTER — Ambulatory Visit: Admitting: Nurse Practitioner

## 2024-03-11 ENCOUNTER — Encounter: Payer: Self-pay | Admitting: Nurse Practitioner

## 2024-03-11 VITALS — BP 133/73 | HR 70 | Ht 64.0 in | Wt 147.0 lb

## 2024-03-11 DIAGNOSIS — I10 Essential (primary) hypertension: Secondary | ICD-10-CM

## 2024-03-11 DIAGNOSIS — M810 Age-related osteoporosis without current pathological fracture: Secondary | ICD-10-CM | POA: Insufficient documentation

## 2024-03-11 DIAGNOSIS — K219 Gastro-esophageal reflux disease without esophagitis: Secondary | ICD-10-CM

## 2024-03-11 DIAGNOSIS — K449 Diaphragmatic hernia without obstruction or gangrene: Secondary | ICD-10-CM

## 2024-03-11 DIAGNOSIS — E782 Mixed hyperlipidemia: Secondary | ICD-10-CM

## 2024-03-11 DIAGNOSIS — E559 Vitamin D deficiency, unspecified: Secondary | ICD-10-CM

## 2024-03-11 MED ORDER — AMLODIPINE BESYLATE 5 MG PO TABS: 5.0000 mg | ORAL_TABLET | Freq: Every day | ORAL | 2 refills | Status: AC

## 2024-03-11 MED ORDER — LISINOPRIL 10 MG PO TABS: 10.0000 mg | ORAL_TABLET | Freq: Every day | ORAL | 2 refills | Status: AC

## 2024-03-11 MED ORDER — ROSUVASTATIN CALCIUM 10 MG PO TABS: 10.0000 mg | ORAL_TABLET | Freq: Every day | ORAL | 3 refills | Status: AC

## 2024-03-11 MED ORDER — ALENDRONATE SODIUM 70 MG PO TABS: 70.0000 mg | ORAL_TABLET | ORAL | 2 refills | Status: AC

## 2024-03-11 MED ORDER — VITAMIN D (ERGOCALCIFEROL) 1.25 MG (50000 UNIT) PO CAPS: 50000.0000 [IU] | ORAL_CAPSULE | ORAL | 1 refills | Status: AC

## 2024-03-11 NOTE — Patient Instructions (Signed)
 Fall Prevention in the Home, Adult Falls can cause injuries and can happen to people of all ages. There are many things you can do to make your home safer and to help prevent falls. What actions can I take to prevent falls? General information Use good lighting in all rooms. Make sure to: Replace any light bulbs that burn out. Turn on the lights in dark areas and use night-lights. Keep items that you use often in easy-to-reach places. Lower the shelves around your home if needed. Move furniture so that there are clear paths around it. Do not use throw rugs or other things on the floor that can make you trip. If any of your floors are uneven, fix them. Add color or contrast paint or tape to clearly mark and help you see: Grab bars or handrails. First and last steps of staircases. Where the edge of each step is. If you use a ladder or stepladder: Make sure that it is fully opened. Do not climb a closed ladder. Make sure the sides of the ladder are locked in place. Have someone hold the ladder while you use it. Know where your pets are as you move through your home. What can I do in the bathroom?     Keep the floor dry. Clean up any water on the floor right away. Remove soap buildup in the bathtub or shower. Buildup makes bathtubs and showers slippery. Use non-skid mats or decals on the floor of the bathtub or shower. Attach bath mats securely with double-sided, non-slip rug tape. If you need to sit down in the shower, use a non-slip stool. Install grab bars by the toilet and in the bathtub and shower. Do not use towel bars as grab bars. What can I do in the bedroom? Make sure that you have a light by your bed that is easy to reach. Do not use any sheets or blankets on your bed that hang to the floor. Have a firm chair or bench with side arms that you can use for support when you get dressed. What can I do in the kitchen? Clean up any spills right away. If you need to reach something  above you, use a step stool with a grab bar. Keep electrical cords out of the way. Do not use floor polish or wax that makes floors slippery. What can I do with my stairs? Do not leave anything on the stairs. Make sure that you have a light switch at the top and the bottom of the stairs. Make sure that there are handrails on both sides of the stairs. Fix handrails that are broken or loose. Install non-slip stair treads on all your stairs if they do not have carpet. Avoid having throw rugs at the top or bottom of the stairs. Choose a carpet that does not hide the edge of the steps on the stairs. Make sure that the carpet is firmly attached to the stairs. Fix carpet that is loose or worn. What can I do on the outside of my home? Use bright outdoor lighting. Fix the edges of walkways and driveways and fix any cracks. Clear paths of anything that can make you trip, such as tools or rocks. Add color or contrast paint or tape to clearly mark and help you see anything that might make you trip as you walk through a door, such as a raised step or threshold. Trim any bushes or trees on paths to your home. Check to see if handrails are loose  or broken and that both sides of all steps have handrails. Install guardrails along the edges of any raised decks and porches. Have leaves, snow, or ice cleared regularly. Use sand, salt, or ice melter on paths if you live where there is ice and snow during the winter. Clean up any spills in your garage right away. This includes grease or oil spills. What other actions can I take? Review your medicines with your doctor. Some medicines can cause dizziness or changes in blood pressure, which increase your risk of falling. Wear shoes that: Have a low heel. Do not wear high heels. Have rubber bottoms and are closed at the toe. Feel good on your feet and fit well. Use tools that help you move around if needed. These include: Canes. Walkers. Scooters. Crutches. Ask  your doctor what else you can do to help prevent falls. This may include seeing a physical therapist to learn to do exercises to move better and get stronger. Where to find more information Centers for Disease Control and Prevention, [employer]: TonerPromos[employer]no [employer] on Aging: BaseRingTones.pl [employer] on [employer]: BaseRingTones.pl Contact a doctor if: You are afraid of falling at home. You feel weak, drowsy, or dizzy at home. You fall at home. Get help right away if you: Lose consciousness or have trouble moving after a fall. Have a fall that causes a head injury. These symptoms may be an emergency. Get help right away. Call 911. Do not wait to see if the symptoms will go away. Do not drive yourself to the hospital. This information is not intended to replace advice given to you by your health care provider. Make sure you discuss any questions you have with your health care provider. Document Revised: 09/25/2021 Document Reviewed: 09/25/2021 [employer] Patient Education  2024 [employer].

## 2024-03-12 ENCOUNTER — Ambulatory Visit: Payer: Self-pay | Admitting: Nurse Practitioner

## 2024-03-12 LAB — LIPID PANEL
Chol/HDL Ratio: 2.2 ratio (ref 0.0–4.4)
Cholesterol, Total: 139 mg/dL (ref 100–199)
HDL: 62 mg/dL
LDL Chol Calc (NIH): 59 mg/dL (ref 0–99)
Triglycerides: 98 mg/dL (ref 0–149)
VLDL Cholesterol Cal: 18 mg/dL (ref 5–40)

## 2024-03-12 LAB — CMP14+EGFR
ALT: 8 [IU]/L (ref 0–32)
AST: 12 [IU]/L (ref 0–40)
Albumin: 4.3 g/dL (ref 3.7–4.7)
Alkaline Phosphatase: 63 [IU]/L (ref 48–129)
BUN/Creatinine Ratio: 13 (ref 12–28)
BUN: 10 mg/dL (ref 8–27)
Bilirubin Total: 0.8 mg/dL (ref 0.0–1.2)
CO2: 22 mmol/L (ref 20–29)
Calcium: 9.5 mg/dL (ref 8.7–10.3)
Chloride: 103 mmol/L (ref 96–106)
Creatinine, Ser: 0.75 mg/dL (ref 0.57–1.00)
Globulin, Total: 2.5 g/dL (ref 1.5–4.5)
Glucose: 97 mg/dL (ref 70–99)
Potassium: 4.1 mmol/L (ref 3.5–5.2)
Sodium: 142 mmol/L (ref 134–144)
Total Protein: 6.8 g/dL (ref 6.0–8.5)
eGFR: 80 mL/min/{1.73_m2}

## 2024-03-12 LAB — CBC WITH DIFFERENTIAL/PLATELET
Basophils Absolute: 0.1 10*3/uL (ref 0.0–0.2)
Basos: 1 %
EOS (ABSOLUTE): 0.3 10*3/uL (ref 0.0–0.4)
Eos: 4 %
Hematocrit: 39.9 % (ref 34.0–46.6)
Hemoglobin: 12.7 g/dL (ref 11.1–15.9)
Immature Grans (Abs): 0 10*3/uL (ref 0.0–0.1)
Immature Granulocytes: 0 %
Lymphocytes Absolute: 2.3 10*3/uL (ref 0.7–3.1)
Lymphs: 24 %
MCH: 29.1 pg (ref 26.6–33.0)
MCHC: 31.8 g/dL (ref 31.5–35.7)
MCV: 92 fL (ref 79–97)
Monocytes Absolute: 0.5 10*3/uL (ref 0.1–0.9)
Monocytes: 5 %
Neutrophils Absolute: 6.4 10*3/uL (ref 1.4–7.0)
Neutrophils: 66 %
Platelets: 353 10*3/uL (ref 150–450)
RBC: 4.36 x10E6/uL (ref 3.77–5.28)
RDW: 12.3 % (ref 11.7–15.4)
WBC: 9.6 10*3/uL (ref 3.4–10.8)

## 2024-09-09 ENCOUNTER — Ambulatory Visit: Payer: Self-pay | Admitting: Nurse Practitioner

## 2024-11-02 ENCOUNTER — Ambulatory Visit

## 2024-11-06 ENCOUNTER — Ambulatory Visit
